# Patient Record
Sex: Male | Born: 1985 | Race: White | Hispanic: No | Marital: Single | Smoking: Never smoker
Health system: Southern US, Community
[De-identification: ages and names within clinical notes are randomized; demographics above are authoritative.]

## PROBLEM LIST (undated history)

## (undated) DIAGNOSIS — I10 Essential (primary) hypertension: Secondary | ICD-10-CM

## (undated) DIAGNOSIS — E785 Hyperlipidemia, unspecified: Secondary | ICD-10-CM

## (undated) DIAGNOSIS — G629 Polyneuropathy, unspecified: Secondary | ICD-10-CM

## (undated) DIAGNOSIS — E119 Type 2 diabetes mellitus without complications: Secondary | ICD-10-CM

## (undated) HISTORY — DX: Polyneuropathy, unspecified: G62.9

## (undated) HISTORY — PX: WISDOM TOOTH EXTRACTION: SHX21

## (undated) HISTORY — PX: MOUTH SURGERY: SHX715

## (undated) HISTORY — DX: Hyperlipidemia, unspecified: E78.5

---

## 2013-02-03 ENCOUNTER — Emergency Department: Payer: Self-pay | Admitting: Emergency Medicine

## 2013-02-03 LAB — CBC
HCT: 45.4 % (ref 40.0–52.0)
MCH: 29.2 pg (ref 26.0–34.0)
MCV: 82 fL (ref 80–100)
Platelet: 244 10*3/uL (ref 150–440)
RBC: 5.55 10*6/uL (ref 4.40–5.90)
RDW: 12.4 % (ref 11.5–14.5)
WBC: 8.5 10*3/uL (ref 3.8–10.6)

## 2013-02-03 LAB — COMPREHENSIVE METABOLIC PANEL
Albumin: 3.7 g/dL (ref 3.4–5.0)
Anion Gap: 9 (ref 7–16)
Bilirubin,Total: 0.6 mg/dL (ref 0.2–1.0)
Chloride: 101 mmol/L (ref 98–107)
Co2: 25 mmol/L (ref 21–32)
EGFR (Non-African Amer.): >60
Glucose: 351 mg/dL — ABNORMAL HIGH (ref 65–99)
SGOT(AST): 36 U/L (ref 15–37)
Sodium: 135 mmol/L — ABNORMAL LOW (ref 136–145)

## 2013-02-03 LAB — LIPASE, BLOOD: Lipase: 113 U/L (ref 73–393)

## 2016-05-18 DIAGNOSIS — Z7984 Long term (current) use of oral hypoglycemic drugs: Secondary | ICD-10-CM | POA: Insufficient documentation

## 2016-05-18 DIAGNOSIS — R55 Syncope and collapse: Secondary | ICD-10-CM | POA: Insufficient documentation

## 2016-05-18 DIAGNOSIS — E860 Dehydration: Secondary | ICD-10-CM | POA: Insufficient documentation

## 2016-05-18 DIAGNOSIS — E1165 Type 2 diabetes mellitus with hyperglycemia: Secondary | ICD-10-CM | POA: Diagnosis not present

## 2016-05-18 DIAGNOSIS — I10 Essential (primary) hypertension: Secondary | ICD-10-CM | POA: Insufficient documentation

## 2016-05-18 NOTE — ED Triage Notes (Signed)
Pt complaining of migraine x this am. Pt states hx of same, migraine feels same, just lasting longer. Pt also complaining of syncopal episode, unsure if struck head. Pt tachy at triage, 120's. Pt denies any chest pain or SHOB. Pt denies any lightheadedness or dizziness at this time. Pt is complaining of some nausea.

## 2016-05-19 ENCOUNTER — Encounter (HOSPITAL_COMMUNITY): Payer: Self-pay

## 2016-05-19 ENCOUNTER — Emergency Department (HOSPITAL_COMMUNITY)
Admission: EM | Admit: 2016-05-19 | Discharge: 2016-05-19 | Disposition: A | Discharge status: Home / Self Care | Payer: 59 | Attending: Emergency Medicine | Admitting: Emergency Medicine

## 2016-05-19 ENCOUNTER — Other Ambulatory Visit: Payer: Self-pay

## 2016-05-19 DIAGNOSIS — R739 Hyperglycemia, unspecified: Secondary | ICD-10-CM

## 2016-05-19 DIAGNOSIS — E860 Dehydration: Secondary | ICD-10-CM

## 2016-05-19 HISTORY — DX: Essential (primary) hypertension: I10

## 2016-05-19 HISTORY — DX: Type 2 diabetes mellitus without complications: E11.9

## 2016-05-19 LAB — BASIC METABOLIC PANEL
Anion gap: 10 (ref 5–15)
BUN: 8 mg/dL (ref 6–20)
CALCIUM: 9.3 mg/dL (ref 8.9–10.3)
CO2: 24 mmol/L (ref 22–32)
CREATININE: 0.76 mg/dL (ref 0.61–1.24)
Chloride: 99 mmol/L — ABNORMAL LOW (ref 101–111)
GFR calc non Af Amer: >60 mL/min (ref >60)
Glucose, Bld: 447 mg/dL — ABNORMAL HIGH (ref 65–99)
Potassium: 3.5 mmol/L (ref 3.5–5.1)
Sodium: 133 mmol/L — ABNORMAL LOW (ref 135–145)

## 2016-05-19 LAB — URINALYSIS, ROUTINE W REFLEX MICROSCOPIC
Bacteria, UA: NONE SEEN
Bilirubin Urine: NEGATIVE
HGB URINE DIPSTICK: NEGATIVE
Ketones, ur: 5 mg/dL — AB
Leukocytes, UA: NEGATIVE
Nitrite: NEGATIVE
PROTEIN: NEGATIVE mg/dL
Specific Gravity, Urine: 1.027 (ref 1.005–1.030)
Squamous Epithelial / LPF: NONE SEEN
pH: 5 (ref 5.0–8.0)

## 2016-05-19 LAB — CBC
HCT: 45.2 % (ref 39.0–52.0)
Hemoglobin: 16.6 g/dL (ref 13.0–17.0)
MCH: 29.5 pg (ref 26.0–34.0)
MCHC: 36.7 g/dL — ABNORMAL HIGH (ref 30.0–36.0)
MCV: 80.4 fL (ref 78.0–100.0)
PLATELETS: 306 10*3/uL (ref 150–400)
RBC: 5.62 MIL/uL (ref 4.22–5.81)
RDW: 12.6 % (ref 11.5–15.5)
WBC: 9.8 10*3/uL (ref 4.0–10.5)

## 2016-05-19 MED ORDER — CANAGLIFLOZIN 100 MG PO TABS: 100.0000 mg | ORAL_TABLET | Freq: Every day | ORAL | 0 refills | Status: DC

## 2016-05-19 MED ORDER — SODIUM CHLORIDE 0.9 % IV BOLUS (SEPSIS)
2000.0000 mL | Freq: Once | INTRAVENOUS | Status: AC
  Administered 2016-05-19: 2000 mL via INTRAVENOUS

## 2016-05-19 NOTE — ED Provider Notes (Signed)
[facility]-EMERGENCY DEPT Provider Note   CSN: [id number] Arrival date & time: 05/18/16  2355   By signing my name below, I, Clovis PuAvnee Patel, attest that this documentation has been prepared under the direction and in the presence of Azalia BilisKevin Ariaunna Longsworth, MD  Electronically Signed: Clovis PuAvnee Patel, ED Scribe. 05/19/16. 4:04 AM.   History   Chief Complaint Chief Complaint  Patient presents with  . Loss of Consciousness  . Migraine   The history is provided by the patient. No language interpreter was used.  HPI Comments:  Carl [name] is a 31 y.o. male who presents to the Emergency Department, with a hx of DM and HTN, who presents to the Emergency Department complaining of sudden onset, syncope related to a migraine which occurred in the AM yesterday. Pt also reports upper extremity weakness, neck stiffness, decrease in urinary output, nausea and vomiting. No alleviating factors noted. Pt denies any tongue biting, bladder/bowel incontinence, hx of seizure, palpitations and chest pain. Pt notes a hx of 1 similar episode which occurred a few years ago. Pt was on invokana for her DM but has run out of this medication.   Past Medical History:  Diagnosis Date  . Diabetes mellitus without complication (HCC)   . Hypertension     There are no active problems to display for this patient.   History reviewed. No pertinent surgical history.     Home Medications    Prior to Admission medications   Not on File    Family History History reviewed. No pertinent family history.  Social History Social History  Substance Use Topics  . Smoking status: Never Smoker  . Smokeless tobacco: Never Used  . Alcohol use Yes     Allergies   Patient has no allergy information on record.   Review of Systems Review of Systems 10 systems reviewed and all are negative for acute change except as noted in the HPI.  Physical Exam Updated Vital Signs BP 142/94   Pulse 103   Resp 13   SpO2 98%   Physical  Exam  Constitutional: He is oriented to person, place, and time. He appears well-developed and well-nourished.  HENT:  Head: Normocephalic and atraumatic.  Eyes: EOM are normal.  Neck: Normal range of motion.  Cardiovascular: Normal rate, regular rhythm, normal heart sounds and intact distal pulses.   Pulmonary/Chest: Effort normal and breath sounds normal. No respiratory distress.  Abdominal: Soft. He exhibits no distension. There is no tenderness.  Musculoskeletal: Normal range of motion.  Neurological: He is alert and oriented to person, place, and time.  Skin: Skin is warm and dry.  Psychiatric: He has a normal mood and affect. Judgment normal.  Nursing note and vitals reviewed.  ED Treatments / Results  DIAGNOSTIC STUDIES:  Oxygen Saturation is 98% on RA, normal by my interpretation.    COORDINATION OF CARE:  3:56 AM Discussed treatment plan with pt at bedside and pt agreed to plan.  Labs (all labs ordered are listed, but only abnormal results are displayed) Labs Reviewed  BASIC METABOLIC PANEL - Abnormal; Notable for the following:       Result Value   Sodium 133 (*)    Chloride 99 (*)    Glucose, Bld 447 (*)    All other components within normal limits  CBC - Abnormal; Notable for the following:    MCHC 36.7 (*)    All other components within normal limits  URINALYSIS, ROUTINE W REFLEX MICROSCOPIC - Abnormal; Notable for the following:  Glucose, UA >=500 (*)    Ketones, ur 5 (*)    All other components within normal limits  CBG MONITORING, ED    EKG  EKG Interpretation  Date/Time:  Friday May 19 2016 00:02:47 EST Ventricular Rate:  115 PR Interval:  164 QRS Duration: 80 QT Interval:  322 QTC Calculation: 445 R Axis:   20 Text Interpretation:  Sinus tachycardia Otherwise normal ECG No old tracing to compare Confirmed by Abrish Erny  MD, Caryn Bee ([id number]) on 05/19/2016 3:56:22 AM       Radiology No results found.  Procedures Procedures (including  critical care time)  Medications Ordered in ED Medications  sodium chloride 0.9 % bolus 2,000 mL (not administered)     Initial Impression / Assessment and Plan / ED Course  I have reviewed the triage vital signs and the nursing notes.  Pertinent labs & imaging results that were available during my care of the patient were reviewed by me and considered in my medical decision making (see chart for details).  Clinical Course     5:24 AM Pt feels better at this time. Dc home in good condition. Refill of his diabetes meds. Pt will need pcp follow up  Final Clinical Impressions(s) / ED Diagnoses   Final diagnoses:  None    New Prescriptions New Prescriptions   No medications on file   I personally performed the services described in this documentation, which was scribed in my presence. The recorded information has been reviewed and is accurate.        Azalia Bilis, MD 05/19/16 [phone]

## 2018-01-14 ENCOUNTER — Encounter: Payer: Self-pay | Admitting: Family Medicine

## 2018-01-14 ENCOUNTER — Ambulatory Visit (INDEPENDENT_AMBULATORY_CARE_PROVIDER_SITE_OTHER): Payer: Managed Care, Other (non HMO) | Admitting: Family Medicine

## 2018-01-14 ENCOUNTER — Other Ambulatory Visit: Payer: Self-pay

## 2018-01-14 VITALS — BP 160/92 | HR 98 | Temp 98.4°F | Ht 69.0 in | Wt 249.4 lb

## 2018-01-14 DIAGNOSIS — Z23 Encounter for immunization: Secondary | ICD-10-CM

## 2018-01-14 DIAGNOSIS — Z114 Encounter for screening for human immunodeficiency virus [HIV]: Secondary | ICD-10-CM

## 2018-01-14 DIAGNOSIS — E11319 Type 2 diabetes mellitus with unspecified diabetic retinopathy without macular edema: Secondary | ICD-10-CM

## 2018-01-14 DIAGNOSIS — E113593 Type 2 diabetes mellitus with proliferative diabetic retinopathy without macular edema, bilateral: Secondary | ICD-10-CM

## 2018-01-14 DIAGNOSIS — Z1159 Encounter for screening for other viral diseases: Secondary | ICD-10-CM | POA: Diagnosis not present

## 2018-01-14 DIAGNOSIS — F640 Transsexualism: Secondary | ICD-10-CM | POA: Insufficient documentation

## 2018-01-14 DIAGNOSIS — Z789 Other specified health status: Secondary | ICD-10-CM | POA: Insufficient documentation

## 2018-01-14 DIAGNOSIS — E119 Type 2 diabetes mellitus without complications: Secondary | ICD-10-CM | POA: Insufficient documentation

## 2018-01-14 DIAGNOSIS — I10 Essential (primary) hypertension: Secondary | ICD-10-CM | POA: Insufficient documentation

## 2018-01-14 MED ORDER — EMPAGLIFLOZIN 25 MG PO TABS
25.0000 mg | ORAL_TABLET | Freq: Every day | ORAL | 0 refills | Status: DC
Start: 2018-01-14 — End: 2018-02-22

## 2018-01-14 MED ORDER — SITAGLIPTIN PHOSPHATE 50 MG PO TABS: 50.0000 mg | ORAL_TABLET | Freq: Every day | ORAL | 3 refills | Status: DC

## 2018-01-14 NOTE — Patient Instructions (Addendum)
It was wonderful to see you today.  Thank you for choosing [facility].   Please call [phone] with any questions about today's appointment.  Please be sure to schedule follow up at the front  desk before you leave today.   Terisa Starr, MD  Family Medicine    Your medications are at the pharmacy   Your labs will be drawn today--- I will call you with results    I will see you in a month

## 2018-01-14 NOTE — Progress Notes (Signed)
Patient Name: Carl [name]  Date of Birth: 28-Mar-1986 Date of Visit: 01/14/18 PCP: Westley Chandler, MD  Chief Complaint: diabetes, discuss medications, PREP therapy   Subjective: Carl [name] is a 32 year old woman presenting today to establish care.  Carl [name] is alone today.  Overall she is doing well.  She reports she recently got a new job at [employer].  This allowed her to have health insurance which she is very pleased about.  She is enjoying her job so far.  Carl [name] has a long-standing history of type 2 diabetes.  She was actually diagnosed as a teen.  Her family history is significant for diabetes in both of her parents as well as grandparents.  She reports she is intolerant of metformin.  She has tried multiple versions and cannot tolerate the medication.  Previously she was very well controlled on an SGLT2 inhibitor DPP 4 inhibitor and small doses of Lantus.  She reports her complication of her diabetes so far has been retinopathy which very much worries her.  She reports her complication from diabetes has been her retinopathy which very much worries her.  She denies polyuria or polydipsia today she has not taken any medications recently she has been without health insurance.  [name] reports her blood pressures frequently elevated when she first presents the office.  Denies chest pain, shortness of breath, vision changes, lower extremity edema.  She does report intermittent right foot pain at times.  She reports her right foot and left ankles have been swelling intermittently at work she is on her feet quite a bit at work.  Denies redness, fevers, recent injury to the area.  She reports a history of migraine headaches.  These frequently occurred in her teen years.  Over the last few years she believes she has developed more of cluster type headaches with which worries her considerably she sometimes takes Excedrin for this.  She denies a history of migraines with aura.  Past medical  history: Diabetes, hyperlipidemia, transgender male  Past surgical history: Oral surgery  Family history significant for diabetes in her mother and father.  Social history: She attended undergraduate school where she received a degree in music.  She is a Museum/gallery curator.  She now works at [employer].  She is enjoying the job.  She finds she is safe in her living situation.  She does not smoke or regularly use alcohol.  She does not use other illicit substances.  In terms of her transgender status she reports that she first began to identify as a male in college.  She became [name] in 2016.  She feels much more comfortable in this body.  She has never had any gender affirming surgeries.  She does desire top surgery.  Her mom supports her this transition.  Her grandmother is somewhat ignores this change.  She is distant from her sister.  She is sexually active with men.  She does not regularly use condoms she has had more than 3 partners in the past year.  She does usually ask them their HIV status.  She is uncertain if she has had any HIV positive partners.  She does engage in anal sex without a condom.    ROS:  ROS  I have reviewed the patient's medical, surgical, family, and social history as appropriate.   Vitals:   01/14/18 0944  BP: (!) 160/92  Pulse: 98  Temp: 98.4 F (36.9 C)  SpO2: 99%   Filed Weights   01/14/18 0944  Weight: 249  lb 6.4 oz (113.1 kg)  BP repeat 138/80  HEENT: Sclera anicteric. Dentition is fair, missing several incisors. Appears well hydrated Neck: Supple, thick neck, no prominent thyroid cartilage Chest: moderate gynecomastia, presence of chest hair (shaved)  Cardiac: Regular rate and rhythm. Normal S1/S2. No murmurs, rubs, or gallops appreciated. Lungs: Clear bilaterally to ascultation.  Abdomen: Normoactive bowel sounds. No tenderness to deep or light palpation. No rebound or guarding.  Extremities: Warm, well perfused without edema. Right ankle with minimal  effusion   Skin: Warm, dry, callus on left dorsum of foot over MTP  Psych: Pleasant and appropriate, kind  Monofilament: reduced sensation on plantar surface of feet  [name] was seen today.   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  This is a chronic condition.  She may have a latency onset diabetes type that could require insulin. -     CBC -     Lipid Panel -     Comprehensive metabolic panel -     empagliflozin (JARDIANCE) 25 MG TABS tablet; Take 25 mg by mouth daily. -     sitaGLIPtin (JANUVIA) 50 MG tablet; Take 1 tablet (50 mg total) by mouth daily. -     Pneumococcal polysaccharide vaccine 23-valent greater than or equal to 2yo subcutaneous/IM  Screening for HIV (human immunodeficiency virus,  Screening for Hepatitis B  Candidate for PREP therapy reviewed most recent CDC guidelines with patient and discussed benefits of daily prep therapy at length. We discussed the benefits of PREP therapy at length recommended hepatitis B testing before this we discussed the interval testing including every throughout the requirement for HIV status testing as well as creatinine testing.  -     HIV antibody (with reflex) -  Offered other STI testing and she declined  - Hep B testing   Need for immunization against influenza -     Flu Vaccine QUAD 36+ mos IM  Transgender Care the patient prefers male pronouns.  We discussed the role of hormone therapy in her treatment.  Could certainly consider the addition of Aldactone as her blood pressure was slightly elevated today and this would have the added side effect of possible gynecomastia.  She would like to first get her diabetes under control before considering estrogen therapy. She has no contraindications to estrogen  therapy at this time.  She does not smoke.  She does not have a history of migraine with aura or history of venous thrombus embolism. Will give name for counselors providing letter of support for gender affirming therapy.    Terisa Starr, MD  Family Medicine Teaching Service

## 2018-01-15 ENCOUNTER — Other Ambulatory Visit (INDEPENDENT_AMBULATORY_CARE_PROVIDER_SITE_OTHER): Payer: Managed Care, Other (non HMO) | Admitting: Family Medicine

## 2018-01-15 ENCOUNTER — Telehealth: Payer: Self-pay

## 2018-01-15 DIAGNOSIS — E785 Hyperlipidemia, unspecified: Secondary | ICD-10-CM

## 2018-01-15 DIAGNOSIS — R739 Hyperglycemia, unspecified: Secondary | ICD-10-CM | POA: Diagnosis not present

## 2018-01-15 DIAGNOSIS — E11319 Type 2 diabetes mellitus with unspecified diabetic retinopathy without macular edema: Secondary | ICD-10-CM

## 2018-01-15 DIAGNOSIS — Z202 Contact with and (suspected) exposure to infections with a predominantly sexual mode of transmission: Secondary | ICD-10-CM

## 2018-01-15 LAB — POCT GLYCOSYLATED HEMOGLOBIN (HGB A1C): HbA1c, POC (controlled diabetic range): 13.3 % — AB (ref 0.0–7.0)

## 2018-01-15 NOTE — Telephone Encounter (Signed)
Pt called nurse line, states that even with the prior Gerre Couch is too expensive. Would like to know if something else could be called in. Pt call back [phone] Shawna Orleans, RN

## 2018-01-15 NOTE — Telephone Encounter (Signed)
Pt called nurse line, states insurance not covering his meds. Submitted PA via Covermymeds for [place]. Status pending. Will recheck in 24 hours. Shawna Orleans, RN

## 2018-01-15 NOTE — Telephone Encounter (Signed)
PA for both [place] approved. [facility] pharmacy notified, patient notified. Shawna Orleans, RN

## 2018-01-15 NOTE — Telephone Encounter (Signed)
Attempted to call patient.   Glipizide and metformin are both covered by insurance under Tier 1, which would be less expensive. She has tried metformin before (I would like to try again). Let me know which she prefers. Will try to call again tomorrow.

## 2018-01-15 NOTE — Telephone Encounter (Signed)
Pt requests a call back when we have answer regarding PA. Call back [phone]

## 2018-01-15 NOTE — Telephone Encounter (Signed)
LMOVM for pt to call us back and let us know if she wants to try metformin again. Damareon Lanni Bruna Potter, CMA

## 2018-01-16 LAB — COMPREHENSIVE METABOLIC PANEL
A/G RATIO: 1.7 (ref 1.2–2.2)
ALBUMIN: 4.3 g/dL (ref 3.5–5.5)
ALT: 56 IU/L — AB (ref 0–44)
AST: 38 IU/L (ref 0–40)
Alkaline Phosphatase: 112 IU/L (ref 39–117)
BILIRUBIN TOTAL: 0.2 mg/dL (ref 0.0–1.2)
BUN / CREAT RATIO: 16 (ref 9–20)
BUN: 10 mg/dL (ref 6–20)
CHLORIDE: 98 mmol/L (ref 96–106)
CO2: 18 mmol/L — ABNORMAL LOW (ref 20–29)
Calcium: 9.6 mg/dL (ref 8.7–10.2)
Creatinine, Ser: 0.64 mg/dL — ABNORMAL LOW (ref 0.76–1.27)
GFR calc non Af Amer: 131 mL/min/{1.73_m2} (ref >59)
GFR, EST AFRICAN AMERICAN: 152 mL/min/{1.73_m2} (ref >59)
GLUCOSE: 328 mg/dL — AB (ref 65–99)
Globulin, Total: 2.5 g/dL (ref 1.5–4.5)
POTASSIUM: 4.7 mmol/L (ref 3.5–5.2)
Sodium: 134 mmol/L (ref 134–144)
TOTAL PROTEIN: 6.8 g/dL (ref 6.0–8.5)

## 2018-01-16 LAB — CBC
HEMOGLOBIN: 15.4 g/dL (ref 13.0–17.7)
Hematocrit: 45.2 % (ref 37.5–51.0)
MCH: 29.2 pg (ref 26.6–33.0)
MCHC: 34.1 g/dL (ref 31.5–35.7)
MCV: 86 fL (ref 79–97)
PLATELETS: 359 10*3/uL (ref 150–450)
RBC: 5.27 x10E6/uL (ref 4.14–5.80)
RDW: 12.9 % (ref 12.3–15.4)
WBC: 6.9 10*3/uL (ref 3.4–10.8)

## 2018-01-16 LAB — LIPID PANEL
Chol/HDL Ratio: 23.3 ratio — ABNORMAL HIGH (ref 0.0–5.0)
Cholesterol, Total: 373 mg/dL — ABNORMAL HIGH (ref 100–199)
HDL: 16 mg/dL — ABNORMAL LOW (ref >39)
Triglycerides: 2273 mg/dL (ref 0–149)

## 2018-01-16 LAB — HEPATITIS B SURFACE ANTIGEN: HEP B S AG: NEGATIVE

## 2018-01-16 LAB — HIV ANTIBODY (ROUTINE TESTING W REFLEX): HIV Screen 4th Generation wRfx: NONREACTIVE

## 2018-01-16 LAB — HEPATITIS B CORE ANTIBODY, TOTAL: Hep B Core Total Ab: NEGATIVE

## 2018-01-16 LAB — HEPATITIS B SURFACE ANTIBODY,QUALITATIVE: Hep B Surface Ab, Qual: REACTIVE

## 2018-01-16 MED ORDER — ATORVASTATIN CALCIUM 10 MG PO TABS: 10.0000 mg | ORAL_TABLET | Freq: Every day | ORAL | 3 refills | Status: DC

## 2018-01-16 MED ORDER — METFORMIN HCL 500 MG PO TABS: 500.0000 mg | ORAL_TABLET | Freq: Every day | ORAL | 3 refills | Status: DC

## 2018-01-16 MED ORDER — EMTRICITABINE-TENOFOVIR DF 200-300 MG PO TABS: 1.0000 | ORAL_TABLET | Freq: Every day | ORAL | 0 refills | Status: DC

## 2018-01-16 NOTE — Telephone Encounter (Signed)
Pt called nurse line returning call. Pt stated would be fine restarting Metformin, however, does better on extended release. Pt is also concerned that Jardiance will no longer being covered if metformin is called in.  Please advise.

## 2018-01-16 NOTE — Telephone Encounter (Signed)
Called patient with lab results.  Confirmed Carl [name] was on the phone.   Diabetes reviewed A1c of 13.3.  She is amenable to trial of oral therapy at this time.  She reports previously her A1c was greater than 13.  She did require insulin for period of time.  We discussed starting insulin.  She is amenable to speaking to Dr. Raymondo Band or 1 of his team members.  I discussed the role of pharmacist in her care.  Will start with a low-dose of metformin as she is been intolerant of this in the past.  She is going to pick up Jardiance later today.  We reviewed the side effect profile and how to take the medication.  I will call her in 2 weeks to ensure that she is tolerating the metformin at that time I will plan to increase the metformin.  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  She is immune to hepatitis B.  HIV test negative.  She confirms that she had a previous negative HIV test more than a month ago.  She has not had intercourse since this time.  We discussed the role of prep therapy.  We reviewed that she is an appropriate candidate.  She has unprotected anal intercourse with multiple partners of whom her their HIV status she does not know.  We discussed that starting the therapy can result in some nausea and discomfort in her abdomen.  We discussed this should be taken and started separately from her metformin.  I would actually recommend starting this prior to her metformin therapy.  We discussed the role of testing for HIV with 3 months.  We discussed the importance of compliance.  We discussed the role of risk reduction strategies including use of condoms regularly.   Hypertriglyceridemia the patient has marked hypertriglyceridemia. Reviewed her family history carefully.  I suspect this is related to her uncontrolled hyperglycemia.  Given the  mortality benefit of a statin will start this at this time.  We will work closely on reducing her blood glucose.  Consideration of a fibrate in the future.  Repeat fasting at follow-up Will discuss further with Dr. Raymondo Band regarding her starting insulin therapy

## 2018-01-16 NOTE — Telephone Encounter (Signed)
Attempted to call  Left message

## 2018-01-24 ENCOUNTER — Telehealth: Payer: Self-pay | Admitting: Family Medicine

## 2018-01-24 DIAGNOSIS — E11319 Type 2 diabetes mellitus with unspecified diabetic retinopathy without macular edema: Secondary | ICD-10-CM

## 2018-01-24 MED ORDER — ACCU-CHEK SOFT TOUCH LANCETS MISC: 12 refills | Status: DC

## 2018-01-24 MED ORDER — ACCU-CHEK AVIVA DEVI: 0 refills | Status: AC

## 2018-01-24 MED ORDER — GLUCOSE BLOOD VI STRP
ORAL_STRIP | 12 refills | Status: DC
Start: 2018-01-24 — End: 2018-11-07

## 2018-01-24 NOTE — Telephone Encounter (Signed)
Called patient to follow-up regarding tolerance of medications.  She reports she is picked up all of her medications.  She has had worsening constipation.  She thinks this is due to metformin.  Recommend she stop for 2 to 3 days to see if this resolved.  She reports she is always had problems with tolerating metformin and believes this is the cause.  She is taking Truvada as prescribed.  She has not missed any days.  She denies side effects of this medications.  Discussed recommendation to check blood glucose each morning for next week.  Will call next week to follow-up blood glucose results.  If persistently elevated will start Lantus with Dr. Macky LowerKoval's assistance.   Carl Starrarina Brown, MD  Family Medicine Teaching Service

## 2018-02-01 ENCOUNTER — Telehealth: Payer: Self-pay | Admitting: Family Medicine

## 2018-02-01 NOTE — Telephone Encounter (Signed)
Left voicemail for Carl [name] to call back regarding blood glucoses in AM over last week.

## 2018-02-15 ENCOUNTER — Encounter: Payer: Self-pay | Admitting: Family Medicine

## 2018-02-15 ENCOUNTER — Other Ambulatory Visit: Payer: Self-pay

## 2018-02-15 ENCOUNTER — Ambulatory Visit (INDEPENDENT_AMBULATORY_CARE_PROVIDER_SITE_OTHER): Payer: Managed Care, Other (non HMO) | Admitting: Family Medicine

## 2018-02-15 VITALS — BP 148/84 | HR 111 | Temp 98.8°F | Ht 69.0 in | Wt 245.0 lb

## 2018-02-15 DIAGNOSIS — M26629 Arthralgia of temporomandibular joint, unspecified side: Secondary | ICD-10-CM | POA: Insufficient documentation

## 2018-02-15 DIAGNOSIS — L989 Disorder of the skin and subcutaneous tissue, unspecified: Secondary | ICD-10-CM

## 2018-02-15 DIAGNOSIS — E11319 Type 2 diabetes mellitus with unspecified diabetic retinopathy without macular edema: Secondary | ICD-10-CM | POA: Diagnosis not present

## 2018-02-15 DIAGNOSIS — E1165 Type 2 diabetes mellitus with hyperglycemia: Secondary | ICD-10-CM | POA: Diagnosis not present

## 2018-02-15 DIAGNOSIS — I10 Essential (primary) hypertension: Secondary | ICD-10-CM | POA: Diagnosis not present

## 2018-02-15 DIAGNOSIS — E785 Hyperlipidemia, unspecified: Secondary | ICD-10-CM

## 2018-02-15 DIAGNOSIS — M26623 Arthralgia of bilateral temporomandibular joint: Secondary | ICD-10-CM | POA: Diagnosis not present

## 2018-02-15 DIAGNOSIS — Z79899 Other long term (current) drug therapy: Secondary | ICD-10-CM

## 2018-02-15 MED ORDER — NAPROXEN 500 MG PO TABS: 500.0000 mg | ORAL_TABLET | Freq: Two times a day (BID) | ORAL | 0 refills | Status: DC

## 2018-02-15 MED ORDER — INSULIN PEN NEEDLE 30G X 8 MM MISC: 11 refills | Status: DC

## 2018-02-15 MED ORDER — LISINOPRIL 5 MG PO TABS: 5.0000 mg | ORAL_TABLET | Freq: Every day | ORAL | 3 refills | Status: DC

## 2018-02-15 MED ORDER — METFORMIN HCL 500 MG PO TABS: 500.0000 mg | ORAL_TABLET | Freq: Two times a day (BID) | ORAL | 3 refills | Status: DC

## 2018-02-15 MED ORDER — INSULIN GLARGINE 100 UNIT/ML SOLOSTAR PEN: 10.0000 [IU] | PEN_INJECTOR | Freq: Every day | SUBCUTANEOUS | 11 refills | Status: DC

## 2018-02-15 NOTE — Progress Notes (Signed)
Patient Name: Carl [name] Date of Birth: 1985/11/19 Date of Visit: 02/15/18 PCP: Westley Chandler, MD  Chief Complaint: diabetes check   Subjective:( Carl [name]) Carl [name] is a pleasant 32 y.o. year old woman with a history type 2 diabetes, elevated blood pressure readings and dyslipidemia presenting today for follow-up she has several concerns today  Carl [name]  very active in her choir.  She reports a several week history of intermittent jaw pain.  She has had this multiple times before she reports bilateral jaw pain.  This is worse at the end of the day.  She has associated mild ear pain.  She denies fevers, parotid gland swelling, neck pain, chest pain, difficulty breathing or swallowing.  She is uncertain if she cracked her teeth at night.  She has not recently seen a dentist  Carl [name] reports her morning blood glucoses range from 1 80-200.  She is taking metformin once daily and Jardiance once daily.  She is unable to afford Januvia.  She continues to take a statin.  She denies signs or symptoms of hypoglycemia.  She denies hypoglycemia.  She is interested in starting Lantus.  Carl [name] has a history of dyslipidemia.  We reviewed her most recent triglyceride level.  She is conduct a plant-based diet.  She has lost 5 pounds. We celebrated the success today.  Carl [name] reports a hyperpigmented skin lesion on her right medial lower extremity.  This occurs every few months.  The lesion comes on is mildly pruritic and it was away after several weeks.  No drainage bleeding or weeping.  She does not think this is related to insect bites   ROS:  ROS Negative for chest pain, worsening headaches, dyspnea.   I have reviewed the patient's medical, surgical, family, and social history as appropriate.   Vitals:   02/15/18 0918  BP: (!) 148/84  Pulse: (!) 111  Temp: 98.8 F (37.1 C)  SpO2: 97%   Filed Weights   02/15/18 0918  Weight: 245 lb (111.1 kg)   HEENT: Sclera anicteric. Dentition is  poor, no abscesses noted. Bilateral tenderness over TMJ. Appears well hydrated. Neck: Supple Cardiac: Regular rate and rhythm. Normal S1/S2. No murmurs, rubs, or gallops appreciated. Lungs: Clear bilaterally to ascultation.  Abdomen: Normoactive bowel sounds. No tenderness to deep or light palpation. No rebound or guarding.  Extremities: Warm, well perfused without edema.  Skin: Warm, dry, several hyperpigmented macules along medial right leg. Psych: Pleasant and appropriate     Carl [name] was seen today. She appears to be doing well.   Diagnoses and all orders for this visit:  Essential hypertension, goal <130/80, would encourage more aggressive goal given family history and age of patient. -     lisinopril (PRINIVIL,ZESTRIL) 5 MG tablet; Take 1 tablet (5 mg total) by mouth daily.  Type 2 diabetes mellitus with hyperglycemia, without long-term current use of insulin (HCC) -     Insulin Glargine (LANTUS) 100 UNIT/ML Solostar Pen; Inject 10 Units into the skin daily. Increase by 2 units for BG >150 -     Insulin Pen Needle (NOVOFINE) 30G X 8 MM MISC; Use to inject insulin daily -     Basic Metabolic Panel; Future -     Lipid Panel; Future - Increase metformin to twice a day - Repeat BMET at follow up -  Needs eye exam   Bilateral temporomandibular joint pain less likely other etiologies.  No signs of trigeminal neuralgia or temporal arteritis.  Could consider use of  tricyclic antidepressant in the future.  Recommended mouthguard use at night. -     naproxen (NAPROSYN) 500 MG tablet; Take 1 tablet (500 mg total) by mouth 2 (two) times daily with a meal.  Skin lesion, possibly insect bite vs. Contact dermatitis, less likely keratoacanthoma.   Dyslipidemia, repeat fasting lipid profile.   PREP.  HIV follow up.  Discussed obtaining other STI testing as well.  Terisa Starr, MD  Family Medicine Teaching Service

## 2018-02-15 NOTE — Assessment & Plan Note (Signed)
Denies signs or symptoms of elevated blood pressure.  Blood pressure has been repeatedly elevated.  Will start with ACE inhibitor at 5 mg.  May need to increase his dose.  Repeat basic metabolic panel in 2 weeks which has been ordered.  We reviewed the side effects including cough and very rare side effect of angioedema

## 2018-02-15 NOTE — Assessment & Plan Note (Addendum)
The patient has had fasting morning blood glucoses of 180 or higher.  She is compliant with Jardiance and metformin once daily at this time.  Given persistent hyperglycemia will start Lantus 10 units nightly.  Instructed to titrate up by 2 units each day for blood glucose is greater than 150 in the morning. We will check in in 2 weeks regarding fasting morning blood sugars

## 2018-02-15 NOTE — Assessment & Plan Note (Addendum)
Reviewed most recent lipid panel.  The patient is taking atorvastatin 40 mg daily.  Will repeat with fasting.  May increase to 80 mg in the future.  We will also consider addition of Vascepa 2 g twice daily.

## 2018-02-15 NOTE — Patient Instructions (Signed)
  Jaw pain: Aleve- just for 7 days---watch NSAIDS with your blood pressure  Repeat Labs in 3 weeks  Make an appointment with me in 2 months  Let me know if there are issues with the insulin  It was wonderful to see you today.  Thank you for choosing [facility].   Please call [phone] with any questions about today's appointment.  Please be sure to schedule follow up at the front  desk before you leave today.   Terisa Starr, MD  Family Medicine

## 2018-02-22 ENCOUNTER — Other Ambulatory Visit: Payer: Self-pay

## 2018-02-22 DIAGNOSIS — E113593 Type 2 diabetes mellitus with proliferative diabetic retinopathy without macular edema, bilateral: Secondary | ICD-10-CM

## 2018-02-22 MED ORDER — EMPAGLIFLOZIN 25 MG PO TABS: 25.0000 mg | ORAL_TABLET | Freq: Every day | ORAL | 3 refills | Status: DC

## 2018-03-08 ENCOUNTER — Other Ambulatory Visit: Payer: Managed Care, Other (non HMO)

## 2018-03-11 ENCOUNTER — Telehealth: Payer: Self-pay

## 2018-03-11 ENCOUNTER — Other Ambulatory Visit: Payer: Self-pay | Admitting: Family Medicine

## 2018-03-11 NOTE — Progress Notes (Unsigned)
Please call patient and remind her (goes by [facility]) that she is due for a check of her kidney function. This has been ordered.

## 2018-03-11 NOTE — Telephone Encounter (Signed)
Called and LVM for patient to call clinic and make an appointment for kidney function.  Glennie Hawk, CMA

## 2018-05-28 ENCOUNTER — Telehealth: Payer: Self-pay | Admitting: Family Medicine

## 2018-05-28 ENCOUNTER — Encounter: Payer: Self-pay | Admitting: Family Medicine

## 2018-05-28 NOTE — Telephone Encounter (Signed)
Called patient X2 to schedule appointment- unable to reach via telephone. Will send letter.

## 2018-07-05 ENCOUNTER — Other Ambulatory Visit: Payer: Self-pay

## 2018-07-05 ENCOUNTER — Emergency Department: Payer: Self-pay

## 2018-07-05 ENCOUNTER — Emergency Department
Admission: EM | Admit: 2018-07-05 | Discharge: 2018-07-05 | Disposition: A | Discharge status: Home / Self Care | Payer: Self-pay | Attending: Student in an Organized Health Care Education/Training Program | Admitting: Student in an Organized Health Care Education/Training Program

## 2018-07-05 ENCOUNTER — Encounter: Payer: Self-pay | Admitting: Emergency Medicine

## 2018-07-05 DIAGNOSIS — Z79899 Other long term (current) drug therapy: Secondary | ICD-10-CM | POA: Insufficient documentation

## 2018-07-05 DIAGNOSIS — I10 Essential (primary) hypertension: Secondary | ICD-10-CM | POA: Insufficient documentation

## 2018-07-05 DIAGNOSIS — J40 Bronchitis, not specified as acute or chronic: Secondary | ICD-10-CM | POA: Insufficient documentation

## 2018-07-05 DIAGNOSIS — E119 Type 2 diabetes mellitus without complications: Secondary | ICD-10-CM | POA: Insufficient documentation

## 2018-07-05 DIAGNOSIS — Z7984 Long term (current) use of oral hypoglycemic drugs: Secondary | ICD-10-CM | POA: Insufficient documentation

## 2018-07-05 LAB — BASIC METABOLIC PANEL
Anion gap: 14 (ref 5–15)
BUN: 12 mg/dL (ref 6–20)
CO2: 22 mmol/L (ref 22–32)
Calcium: 9 mg/dL (ref 8.9–10.3)
Chloride: 100 mmol/L (ref 98–111)
Creatinine, Ser: 0.88 mg/dL (ref 0.61–1.24)
GFR calc Af Amer: >60 mL/min (ref >60)
GFR calc non Af Amer: >60 mL/min (ref >60)
Glucose, Bld: 400 mg/dL — ABNORMAL HIGH (ref 70–99)
Potassium: 3.8 mmol/L (ref 3.5–5.1)
Sodium: 136 mmol/L (ref 135–145)

## 2018-07-05 LAB — CBC WITH DIFFERENTIAL/PLATELET
Abs Immature Granulocytes: 0.03 10*3/uL (ref 0.00–0.07)
Basophils Absolute: 0 10*3/uL (ref 0.0–0.1)
Basophils Relative: 0 %
Eosinophils Absolute: 0 10*3/uL (ref 0.0–0.5)
Eosinophils Relative: 0 %
HCT: 42.3 % (ref 39.0–52.0)
Hemoglobin: 14.7 g/dL (ref 13.0–17.0)
Immature Granulocytes: 0 %
Lymphocytes Relative: 13 %
Lymphs Abs: 1.3 10*3/uL (ref 0.7–4.0)
MCH: 28.2 pg (ref 26.0–34.0)
MCHC: 34.8 g/dL (ref 30.0–36.0)
MCV: 81.2 fL (ref 80.0–100.0)
Monocytes Absolute: 0.8 10*3/uL (ref 0.1–1.0)
Monocytes Relative: 9 %
Neutro Abs: 7.3 10*3/uL (ref 1.7–7.7)
Neutrophils Relative %: 78 %
Platelets: 248 10*3/uL (ref 150–400)
RBC: 5.21 MIL/uL (ref 4.22–5.81)
RDW: 12.6 % (ref 11.5–15.5)
WBC: 9.5 10*3/uL (ref 4.0–10.5)
nRBC: 0 % (ref 0.0–0.2)

## 2018-07-05 LAB — GLUCOSE, CAPILLARY: Glucose-Capillary: 345 mg/dL — ABNORMAL HIGH (ref 70–99)

## 2018-07-05 MED ORDER — SODIUM CHLORIDE 0.9 % IV BOLUS
1000.0000 mL | Freq: Once | INTRAVENOUS | Status: AC
  Administered 2018-07-05: 1000 mL via INTRAVENOUS

## 2018-07-05 MED ORDER — PSEUDOEPH-BROMPHEN-DM 30-2-10 MG/5ML PO SYRP: 5.0000 mL | ORAL_SOLUTION | Freq: Four times a day (QID) | ORAL | 0 refills | Status: DC | PRN

## 2018-07-05 MED ORDER — DOXYCYCLINE MONOHYDRATE 100 MG PO CAPS: 100.0000 mg | ORAL_CAPSULE | Freq: Two times a day (BID) | ORAL | 0 refills | Status: DC

## 2018-07-05 MED ORDER — LIDOCAINE 5 % EX PTCH: 1.0000 | MEDICATED_PATCH | CUTANEOUS | Status: DC

## 2018-07-05 MED ORDER — ACETAMINOPHEN 325 MG PO TABS
650.0000 mg | ORAL_TABLET | Freq: Once | ORAL | Status: DC | PRN
  Filled 2018-07-05: qty 2

## 2018-07-05 MED ORDER — BENZONATATE 100 MG PO CAPS
200.0000 mg | ORAL_CAPSULE | Freq: Once | ORAL | Status: AC
  Administered 2018-07-05: 200 mg via ORAL
  Filled 2018-07-05: qty 2

## 2018-07-05 MED ORDER — DOXYCYCLINE HYCLATE 100 MG PO TABS
100.0000 mg | ORAL_TABLET | Freq: Once | ORAL | Status: AC
  Administered 2018-07-05: 100 mg via ORAL
  Filled 2018-07-05: qty 1

## 2018-07-05 MED ORDER — KETOROLAC TROMETHAMINE 30 MG/ML IJ SOLN
30.0000 mg | Freq: Once | INTRAMUSCULAR | Status: AC
  Administered 2018-07-05: 30 mg via INTRAVENOUS
  Filled 2018-07-05: qty 1

## 2018-07-05 MED ORDER — ONDANSETRON HCL 4 MG/2ML IJ SOLN
4.0000 mg | Freq: Once | INTRAMUSCULAR | Status: AC
  Administered 2018-07-05: 4 mg via INTRAVENOUS
  Filled 2018-07-05: qty 2

## 2018-07-05 NOTE — ED Triage Notes (Signed)
Pt arrives with complaints of a productive cough, generalized body body aches, and fever that started yesterday.

## 2018-07-05 NOTE — ED Notes (Signed)

## 2018-07-05 NOTE — ED Provider Notes (Signed)
[facility] Emergency Department Provider Note   ____________________________________________   First MD Initiated Contact with Patient 07/05/18 1629     (approximate)  I have reviewed the triage vital signs and the nursing notes.   HISTORY  Chief Complaint Cough  HPI Carl [name] is a 33 y.o. adult patient presents with productive cough, generalized body aches, and fever started yesterday.  Patient state has taken flu shot for this season.  Patient state nausea and vomiting earlier today.  Patient state has taken flu shot for this season.  Patient presents for temperature 100.5 but states he took Tylenol prior to arrival.   Past Medical History:  Diagnosis Date  . Diabetes mellitus without complication (HCC)   . Dyslipidemia   . Hypertension   . Hypertension     Patient Active Problem List   Diagnosis Date Noted  . Skin lesion 02/15/2018  . 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 Dyslipidemia 02/15/2018  . Diabetes (HCC) 01/14/2018  . Essential hypertension 01/14/2018  . Prefers Male Pronouns--- [name]  01/14/2018    Past Surgical History:  Procedure Laterality Date  . MOUTH SURGERY      Prior to Admission medications   Medication Sig Start Date End Date Taking? Authorizing Provider  atorvastatin (LIPITOR) 10 MG tablet Take 1 tablet (10 mg total) by mouth daily. 01/16/18   Westley Chandler, MD  Blood Glucose Monitoring Suppl (ACCU-CHEK AVIVA) device Check glucose once daily 01/24/18 01/24/19  Westley Chandler, MD  brompheniramine-pseudoephedrine-DM 30-2-10 MG/5ML syrup Take 5 mLs by mouth 4 (four) times daily as needed. 07/05/18   Joni Reining, PA-C  doxycycline (MONODOX) 100 MG capsule Take 1 capsule (100 mg total) by mouth 2 (two) times daily. 07/05/18   Joni Reining, PA-C  empagliflozin (JARDIANCE) 25 MG TABS tablet Take 25 mg by mouth daily. 02/22/18   Westley Chandler, MD  emtricitabine-tenofovir (TRUVADA) 200-300 MG tablet Take 1 tablet by  mouth daily. 01/16/18   Westley Chandler, MD  glucose blood [facility] ACTIVE STRIPS) test strip Use as instructed 01/24/18   Westley Chandler, MD  Insulin Glargine (LANTUS) 100 UNIT/ML Solostar Pen Inject 10 Units into the skin daily. Increase by 2 units for BG >150 02/15/18   Westley Chandler, MD  Insulin Pen Needle (NOVOFINE) 30G X 8 MM MISC Use to inject insulin daily 02/15/18   Westley Chandler, MD  Lancets (ACCU-CHEK SOFT [name]) lancets Check glucose once daily 01/24/18   Westley Chandler, MD  lisinopril (PRINIVIL,ZESTRIL) 5 MG tablet Take 1 tablet (5 mg total) by mouth daily. 02/15/18   Westley Chandler, MD  metFORMIN (GLUCOPHAGE) 500 MG tablet Take 1 tablet (500 mg total) by mouth 2 (two) times daily with a meal. 02/15/18   Westley Chandler, MD  naproxen (NAPROSYN) 500 MG tablet Take 1 tablet (500 mg total) by mouth 2 (two) times daily with a meal. 02/15/18   Westley Chandler, MD    Allergies Patient has no known allergies.  Family History  Problem Relation Age of Onset  . Diabetes Mother   . Diabetes Father   . Diabetes Paternal Grandmother   . Diabetes Paternal Grandfather     Social History Social History   Tobacco Use  . Smoking status: Never Smoker  . Smokeless tobacco: Never Used  Substance Use Topics  . Alcohol use: Yes  . Drug use: No    Review of Systems  Constitutional: No fever/chills Eyes: No visual changes.  ENT: No sore throat. Cardiovascular: Denies chest pain. Respiratory: Denies shortness of breath. Gastrointestinal: No abdominal pain.  No nausea, no vomiting.  No diarrhea.  No constipation. Genitourinary: Negative for dysuria. Musculoskeletal: Negative for back pain. Skin: Negative for rash. Neurological: Negative for headaches, focal weakness or numbness. Endocrine:  Diabetes, dyslipidemia, and hypertension.  Allergic/Immunilogical: HIV. ____________________________________________   PHYSICAL EXAM:  VITAL SIGNS: ED Triage Vitals  Enc Vitals Group      BP 07/05/18 1618 140/70     Pulse Rate 07/05/18 1618 (!) 129     Resp 07/05/18 1618 18     Temp 07/05/18 1618 (!) 100.5 F (38.1 C)     Temp src --      SpO2 07/05/18 1618 96 %     Weight 07/05/18 1619 242 lb (109.8 kg)     Height 07/05/18 1619  (1.778 m)     Head Circumference --      Peak Flow --      Pain Score --      Pain Loc --      Pain Edu? --      Excl. in GC? --     Constitutional: Alert and oriented. Well appearing and in no acute distress. Eyes: Conjunctivae are normal. PERRL. EOMI. Head: Atraumatic. Nose: No congestion/rhinnorhea. Mouth/Throat: Mucous membranes are moist.  Oropharynx non-erythematous. Neck: No stridor.  Hematological/Lymphatic/Immunilogical: No cervical lymphadenopathy. Cardiovascular: Normal rate, regular rhythm. Grossly normal heart sounds.  Good peripheral circulation.  Tachycardic. Respiratory: Normal respiratory effort.  No retractions. Lungs CTAB. Gastrointestinal: Soft and nontender. No distention. No abdominal bruits. No CVA tenderness. Musculoskeletal: No lower extremity tenderness nor edema.  No joint effusions. Neurologic:  Normal speech and language. No gross focal neurologic deficits are appreciated. No gait instability. Skin:  Skin is warm, dry and intact. No rash noted. Psychiatric: Mood and affect are normal. Speech and behavior are normal.  ____________________________________________   LABS (all labs ordered are listed, but only abnormal results are displayed)  Labs Reviewed  BASIC METABOLIC PANEL - Abnormal; Notable for the following components:      Result Value   Glucose, Bld 400 (*)    All other components within normal limits  GLUCOSE, CAPILLARY - Abnormal; Notable for the following components:   Glucose-Capillary 345 (*)    All other components within normal limits  CBC WITH DIFFERENTIAL/PLATELET  CBG MONITORING, ED    ____________________________________________  EKG   ____________________________________________  RADIOLOGY  ED MD interpretation:    Official radiology report(s): Dg Chest 2 View  Result Date: 07/05/2018 CLINICAL DATA:  Cough and fever EXAM: CHEST - 2 VIEW COMPARISON:  None. FINDINGS: Lungs are clear. Heart size and pulmonary vascularity are normal. No adenopathy. No bone lesions. IMPRESSION: No edema or consolidation. Electronically Signed   By: Bretta Bang III M.D.   On: 07/05/2018 16:58    ____________________________________________   PROCEDURES  Procedure(s) performed (including Critical Care):  Procedures   ____________________________________________   INITIAL IMPRESSION / ASSESSMENT AND PLAN / ED COURSE  As part of my medical decision making, I reviewed the following data within the electronic MEDICAL RECORD NUMBER     Patient presents onset of productive cough, generalized body aches, and fever which started yesterday.  Physical exam is consistent with bronchitis.  Patient given discharge care instructions.  Discussed negative x-ray findings.  Patient advised follow-up PCP to discuss better glucose control.  Take medications as directed.      ____________________________________________   FINAL CLINICAL IMPRESSION(S) /  ED DIAGNOSES  Final diagnoses:  None     ED Discharge Orders         Ordered    brompheniramine-pseudoephedrine-DM 30-2-10 MG/5ML syrup  4 times daily PRN     07/05/18 1806    doxycycline (MONODOX) 100 MG capsule  2 times daily     07/05/18 1806           Note:  This document was prepared using Dragon voice recognition software and may include unintentional dictation errors.    Joni Reining, PA-C 07/05/18 1810    Willy Eddy, MD 07/05/18 2025

## 2018-07-05 NOTE — Discharge Instructions (Addendum)
Follow discharge care instructions take medication as directed.  Advised to follow-up with PCP to discuss glucose control.

## 2018-11-07 ENCOUNTER — Ambulatory Visit (INDEPENDENT_AMBULATORY_CARE_PROVIDER_SITE_OTHER): Payer: Self-pay | Admitting: Family Medicine

## 2018-11-07 ENCOUNTER — Encounter: Payer: Self-pay | Admitting: Family Medicine

## 2018-11-07 ENCOUNTER — Other Ambulatory Visit (HOSPITAL_COMMUNITY)
Admission: RE | Admit: 2018-11-07 | Discharge: 2018-11-07 | Disposition: A | Discharge status: Home / Self Care | Payer: Self-pay | Source: Ambulatory Visit | Attending: Family Medicine | Admitting: Family Medicine

## 2018-11-07 ENCOUNTER — Other Ambulatory Visit: Payer: Self-pay

## 2018-11-07 ENCOUNTER — Telehealth: Payer: Self-pay | Admitting: *Deleted

## 2018-11-07 VITALS — BP 138/92 | HR 101

## 2018-11-07 DIAGNOSIS — Z113 Encounter for screening for infections with a predominantly sexual mode of transmission: Secondary | ICD-10-CM | POA: Insufficient documentation

## 2018-11-07 DIAGNOSIS — Z91148 Patient's other noncompliance with medication regimen for other reason: Secondary | ICD-10-CM | POA: Insufficient documentation

## 2018-11-07 DIAGNOSIS — E11319 Type 2 diabetes mellitus with unspecified diabetic retinopathy without macular edema: Secondary | ICD-10-CM

## 2018-11-07 DIAGNOSIS — Z9114 Patient's other noncompliance with medication regimen: Secondary | ICD-10-CM | POA: Insufficient documentation

## 2018-11-07 DIAGNOSIS — I10 Essential (primary) hypertension: Secondary | ICD-10-CM

## 2018-11-07 DIAGNOSIS — E1165 Type 2 diabetes mellitus with hyperglycemia: Secondary | ICD-10-CM

## 2018-11-07 DIAGNOSIS — Z79899 Other long term (current) drug therapy: Secondary | ICD-10-CM | POA: Insufficient documentation

## 2018-11-07 LAB — POCT GLYCOSYLATED HEMOGLOBIN (HGB A1C): HbA1c, POC (controlled diabetic range): 12.9 % — AB (ref 0.0–7.0)

## 2018-11-07 MED ORDER — ACCU-CHEK ACTIVE VI STRP: ORAL_STRIP | 12 refills | Status: DC

## 2018-11-07 MED ORDER — INSULIN GLARGINE 100 UNIT/ML SOLOSTAR PEN: 10.0000 [IU] | PEN_INJECTOR | Freq: Every day | SUBCUTANEOUS | 11 refills | Status: DC

## 2018-11-07 MED ORDER — INSULIN PEN NEEDLE 30G X 8 MM MISC: 11 refills | Status: DC

## 2018-11-07 NOTE — Assessment & Plan Note (Signed)
Will reach out to [facility]. Patient called during appointment. Baseline labs obtained again today. Will work to establish with [facility] vs. Obtain Truvada through another source in near future.

## 2018-11-07 NOTE — Progress Notes (Signed)
21 

## 2018-11-07 NOTE — Telephone Encounter (Signed)
Yes- once per day before insulin injection in AM. Please call [facility] and let them know. Thanks!

## 2018-11-07 NOTE — Progress Notes (Addendum)
  Patient Name: Carl [name] Date of Birth: 1985-11-15 Date of Visit: 11/07/18 PCP: Martyn Malay, MD  Chief Complaint: restart medications, new partner and questions   Subjective: Carl [name]  is a pleasant 33 y.o. with medical history significant for hypertension, type 2 diabetes, obesity, and hypertension  presenting today for check-in.  The patient has not been seen in about 9 months due to insurance.  She has lost her job and is on been unable to find new employment due to the current pandemic.  She finds considerable stress in this.  Gender affirming therapy Most interested in hair removal at this time and has been seeking treatment for this.   New partner The patient does have a new partner their name is Carl [name].  They have not yet started to become intimate but plan to do so in the coming weeks.  They are going to [place] next week.  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  Diabetes Patient's A1c today is 12.2.  She denies polyuria or polydipsia. Endorses blurry vision at times.   HTN Not taking any medications due to insurance status. Denies headaches or chest pain.    ROS: As above  ROS  I have reviewed the patient's medical, surgical, family, and social history as appropriate.   Vitals:   11/07/18 0957  BP: (!) 138/92  Pulse: (!) 101  SpO2: 99%   There were no vitals filed for this visit. HEENT: Sclera anicteric. Dentition is poor . Appears well hydrated. Neck: Supple Cardiac: Regular rate and rhythm. Normal S1/S2. No murmurs, rubs, or gallops appreciated. Lungs: Clear bilaterally to ascultation.  Skin: Warm, dry ecchymoses, healing, on bilateral lower extremities Psych: Pleasant and appropriate    Diabetes ([place]) Patient currently without insurance.  He has been off medications for many months.  He endorsed some blurry vision.  No polyuria polydipsia.  A sample of Lantus was given to him today also set him up with [employer] financial assistance.  He  lives in [place] which may make any medications difficult.  Will discuss again with financial coordinator. Started Lantus 10 U will titrate upwards, based on AM BG. Rx for test strips and pen needles, has other supplies.   Essential hypertension No longer on medications, BP still elevated. Has strong family history of CKD. Will restart as able with financial assistance (ACE first).   On pre-exposure prophylaxis for HIV Will reach out to [facility]. Patient called during appointment. Baseline labs obtained again today. Will work to establish with [facility] vs. Obtain Truvada through another source this week.   Insurance Concerns, paperwork for [employer] given to patient. Will work with Kennyth Lose re: resources in [place].   RTC in 1 month.  Will call with labs.   Dorris Singh, MD  Family Medicine Teaching Service

## 2018-11-07 NOTE — Assessment & Plan Note (Signed)
Patient currently without insurance.  He has been off medications for many months.  He endorsed some blurry vision.  No polyuria polydipsia.  A sample of Lantus was given to him today also set him up with [employer] [facility] financial assistance.  He lives in [place] which may make any medications difficult.  Will discuss again with financial coordinator.

## 2018-11-07 NOTE — Telephone Encounter (Signed)
Shane informed. Christen Bame, CMA

## 2018-11-07 NOTE — Telephone Encounter (Signed)
Shane from [employer] called. They need to know how often pt is checking sugars.  Looks like once a day but want to verify with MD.  Christen Bame, CMA

## 2018-11-07 NOTE — Patient Instructions (Addendum)
It was wonderful to see you today.  Thank you for choosing [facility].   Please call [phone] with any questions about today's appointment.  Please be sure to schedule follow up at the front  desk before you leave today.   Dorris Singh, MD  Family Medicine     We will call you with the results of your blood work.  For your diabetes your A1c is quite elevated.  It is important that we are able to get you restarted on your medications.    For your insulin please go obtain your test strips and pen needles today.  Tomorrow morning check your blood sugar.  If it is over 150 inject 10 units of insulin.  For every day your blood sugars were over 150 go by 2 units.  For example if your blood sugar in the morning is 176 increase your insulin 12 units.  Continue increasing by 2 units each day until your morning blood sugar is less than 150.  Once your blood sugar is 150 or less in the morning you can continue the same dose of insulin that you were on the day before.  If your blood sugar is less than 100 in the morning please call our clinic.  Do not inject your insulin before you call our clinic on that day.    Please fill out the financial aid packet for [facility]   Follow these steps to PrEP  1. Call [phone] and make an appointment   2. Bring proof of address, 2 pay stubs, or W-2 to your first appointment  3. Meet with clinical pharmacist & staff to discuss PrEP regimen & complete financial assistance application  4. Complete lab work  5. Receive PrEP medication the following week    [facility], [address] [place] [address] [phone]

## 2018-11-07 NOTE — Assessment & Plan Note (Signed)
No longer on medications, BP still elevated. Has strong family history of CKD. Will restart as able with financial assistance ([facility] first).

## 2018-11-09 LAB — BASIC METABOLIC PANEL
BUN/Creatinine Ratio: 11 (ref 9–20)
BUN: 9 mg/dL (ref 6–20)
CO2: 19 mmol/L — ABNORMAL LOW (ref 20–29)
Calcium: 9.7 mg/dL (ref 8.7–10.2)
Chloride: 98 mmol/L (ref 96–106)
Creatinine, Ser: 0.81 mg/dL (ref 0.76–1.27)
GFR calc Af Amer: 137 mL/min/{1.73_m2} (ref >59)
GFR calc non Af Amer: 118 mL/min/{1.73_m2} (ref >59)
Glucose: 414 mg/dL — ABNORMAL HIGH (ref 65–99)
Potassium: 4.4 mmol/L (ref 3.5–5.2)
Sodium: 136 mmol/L (ref 134–144)

## 2018-11-09 LAB — HIV ANTIBODY (ROUTINE TESTING W REFLEX): HIV Screen 4th Generation wRfx: NONREACTIVE

## 2018-11-09 LAB — HCV COMMENT:

## 2018-11-09 LAB — RPR: RPR Ser Ql: NONREACTIVE

## 2018-11-09 LAB — HEPATITIS C ANTIBODY (REFLEX): HCV Ab: <0.1 s/co ratio (ref 0.0–0.9)

## 2018-11-09 LAB — HEPATITIS B SURFACE ANTIGEN: Hepatitis B Surface Ag: NEGATIVE

## 2018-11-10 ENCOUNTER — Telehealth: Payer: Self-pay | Admitting: Family Medicine

## 2018-11-10 NOTE — Telephone Encounter (Signed)
Called patient to discuss results, all questions answered. She will call [facility] re: PREP tomorrow. Will call [facility] again tomorrow. Dorris Singh, MD  Family Medicine Teaching Service

## 2018-11-10 NOTE — Telephone Encounter (Signed)
Attempted to call patient with results.   Dorris Singh, MD  Family Medicine Teaching Service

## 2018-11-11 ENCOUNTER — Telehealth: Payer: Self-pay

## 2018-11-11 NOTE — Telephone Encounter (Signed)
Tomorrow at 230pm is the only time this week because I am on PAL starting on Wednesday. If not, then I've got availability all next week. Thank you!

## 2018-11-11 NOTE — Telephone Encounter (Signed)
PCP called to coordinate prep therapy for patient. Patient would like appointment this week. [name] "she" is going on vaction next week .  Routing to Cassie for advise to schedule patient this week

## 2018-11-11 NOTE — Telephone Encounter (Signed)
Left voicemail for [facility] to coordinate prep therapy. Dorris Singh, MD  Family Medicine Teaching Service

## 2018-11-11 NOTE — Telephone Encounter (Signed)
Patient called to offer appointment for 7/7. Patient declined. Scheduled Prep appointment for next week.  Eugenia Mcalpine, LPN

## 2018-11-11 NOTE — Telephone Encounter (Signed)
Thank you :)

## 2018-11-11 NOTE — Telephone Encounter (Signed)
Sorry just saw that she will be on vacation next week.  But, tomorrow at 230pm is the only time I can fit her in. Thanks!

## 2018-11-12 LAB — URINE CYTOLOGY ANCILLARY ONLY
Chlamydia: NEGATIVE
Neisseria Gonorrhea: NEGATIVE

## 2018-11-21 ENCOUNTER — Telehealth: Payer: Self-pay | Admitting: Pharmacy Technician

## 2018-11-21 ENCOUNTER — Encounter: Payer: Self-pay | Admitting: Pharmacist

## 2018-11-21 NOTE — Telephone Encounter (Signed)
[facility] Patient Advocate Encounter ° °Insurance verification completed.   ° °The patient is uninsured and will need patient assistance for medication. ° °We can complete the application and will need to meet with the patient for signatures and income documentation. ° °Rodrecus Belsky E. Artia Singley, CPhT °Specialty Pharmacy Patient Advocate °[facility] °Phone: [phone] °Fax:  [phone] ° ° °

## 2020-12-13 ENCOUNTER — Ambulatory Visit: Payer: BC Managed Care – PPO | Admitting: Family Medicine

## 2020-12-13 ENCOUNTER — Other Ambulatory Visit: Payer: Self-pay

## 2020-12-13 ENCOUNTER — Encounter: Payer: Self-pay | Admitting: Family Medicine

## 2020-12-13 VITALS — BP 180/90 | HR 104 | Ht 70.0 in | Wt 235.0 lb

## 2020-12-13 DIAGNOSIS — Z114 Encounter for screening for human immunodeficiency virus [HIV]: Secondary | ICD-10-CM

## 2020-12-13 DIAGNOSIS — Z1159 Encounter for screening for other viral diseases: Secondary | ICD-10-CM

## 2020-12-13 DIAGNOSIS — E11319 Type 2 diabetes mellitus with unspecified diabetic retinopathy without macular edema: Secondary | ICD-10-CM

## 2020-12-13 DIAGNOSIS — I10 Essential (primary) hypertension: Secondary | ICD-10-CM

## 2020-12-13 DIAGNOSIS — R200 Anesthesia of skin: Secondary | ICD-10-CM

## 2020-12-13 DIAGNOSIS — Z79899 Other long term (current) drug therapy: Secondary | ICD-10-CM

## 2020-12-13 DIAGNOSIS — E1165 Type 2 diabetes mellitus with hyperglycemia: Secondary | ICD-10-CM

## 2020-12-13 DIAGNOSIS — R202 Paresthesia of skin: Secondary | ICD-10-CM

## 2020-12-13 DIAGNOSIS — M25512 Pain in left shoulder: Secondary | ICD-10-CM

## 2020-12-13 DIAGNOSIS — G8929 Other chronic pain: Secondary | ICD-10-CM

## 2020-12-13 LAB — POCT GLYCOSYLATED HEMOGLOBIN (HGB A1C): HbA1c, POC (controlled diabetic range): 11.4 % — AB (ref 0.0–7.0)

## 2020-12-13 MED ORDER — INSULIN PEN NEEDLE 30G X 8 MM MISC: 11 refills | Status: DC

## 2020-12-13 MED ORDER — ACCU-CHEK AVIVA PLUS W/DEVICE KIT: PACK | 0 refills | Status: DC

## 2020-12-13 MED ORDER — GLUCOSE BLOOD VI STRP: ORAL_STRIP | 1 refills | Status: DC

## 2020-12-13 MED ORDER — ACCU-CHEK SOFTCLIX LANCETS MISC: 12 refills | Status: DC

## 2020-12-13 MED ORDER — LOSARTAN POTASSIUM 50 MG PO TABS: 50.0000 mg | ORAL_TABLET | Freq: Every day | ORAL | 3 refills | Status: DC

## 2020-12-13 MED ORDER — INSULIN GLARGINE 100 UNIT/ML SOLOSTAR PEN: 10.0000 [IU] | PEN_INJECTOR | Freq: Every day | SUBCUTANEOUS | 1 refills | Status: DC

## 2020-12-13 NOTE — Progress Notes (Signed)
SUBJECTIVE:   CHIEF COMPLAINT: check up, re-establish care HPI:   Carl [name] is a 35 y.o. yo with history notable for type 2 diabetes, hypertension, and obesity presenting for follow up .  The patient is taking Flonase, Zyrtec, and Nexium. No medications for DM or HTN due to lapse of insurance.  She is reestablishing care today as she has been out of insurance.  She is living with her grandparents.  She is not currently sexually active.  She is currently working at [employer].  Bilateral Leg Numbness  The patient has noticed quite sudden onset of bilateral numbness and tingling in her lower extremities.  She reports that this kind of started out of the blue.  She does report that is worse when her sugars are higher.  She denies urinary incontinence, back pain upper extremity numbness or tingling.  She is not very restrictive diet.  Hypertension The patient is taking no medications currently.  She reports persistent cough from lisinopril.  She does notice more frequent migraines the past several years but no headache today.  She denies difficulty seeing, chest pain, dyspnea on exertion or headache.   She has a family history of renal disease.  Type 2 Diabetes  The patient has an A1c of 11.4 today.  She is taking no diabetic medications currently.  She reports she has tried metformin now for times and has persistent stomach upset from this.  She is interested in a continuous glucose monitor.  She is used Lantus pens before and is interested in trying these again.  She would like to follow-up with pharmacy to discuss her medication options as well as consider a continuous glucose monitor if her insurance would pay for it.  Left Shoulder Pain  The patient is right-hand dominant.  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  She was seen in urgent care with this and diagnosed with it.  She has not had imaging.  She reports limited range of motion and some pain and  stiffness in her left shoulder.  She has intermittent neck pain.  She denies radiating pain, weakness in her hand or wrist.  Cough The patient reports several year history of cough.  She thinks this is been ongoing about a year.  She reports she uses Flonase daily and a PPI.  She knows when she eats spicy food she persistently coughs after this.  She also has an intermittent cough that is nonproductive.  She denies fevers, chills, unintentional weight loss.  She does report a remote history of asthma as a child and reports intermittent bronchospasm symptoms at times.  She does not smoke or use cannabis products.  There is no smoke in the household.  She is compliant with her PPI therapy and intranasal corticosteroid.  PERTINENT  PMH / PSH/Family/Social History : updated and reviewed   OBJECTIVE:   BP (!) 180/90   Pulse (!) 104   Ht 5\' 10"  (1.778 m)   Wt 235 lb (106.6 kg)   SpO2 97%   BMI 33.72 kg/m   Today's weight:  Last Weight  Most recent update: 12/13/2020  8:39 AM    Weight  106.6 kg (235 lb)            Review of prior weights: 02/12/2021   12/13/20 0838  Weight: 235 lb (106.6 kg)     Cardiac: Regular rate and rhythm. Normal S1/S2. No murmurs, rubs, or gallops appreciated. Lungs: Clear bilaterally  to ascultation.  Abdomen: Normoactive bowel sounds. No tenderness to deep or light palpation. No rebound or guarding.  Psych: Pleasant and appropriate   Insensate to monofilament bilaterally. She is insensate to monofilament to the knee on the right in the mid calf on the left.  She does have some atrophy of the left medial gastrocnemius.  Neck examined there is mild kyphosis.  Tenderness to palpation along left trapezius.  Also some tenderness to palpation along the left biceps.  Reduced forward flexion and AB duction with both active and passive range of motion on left as compared to right.  Positive empty can test on left. Patellar reflexes 1+  Normal gait  Negative  spurlings for pain or reproducibility  ASSESSMENT/PLAN:   Essential hypertension Had a cough with lisinopril.  Restarted losartan at 50 mg given elevation.  Suspect she will need a second agent.  Repeat BMP with Dr. Landry Dyke visit.  Diabetes (HCC) Discussed long-term complications.  See separate problem for neuropathy.  Denies nausea, vomiting, polyuria, polydipsia.  Restarted Lantus 10 units and expect will need to titrate this up.  Have also sent full supplies to her pharmacy.  We discussed reasons to call and return to care.  We discussed reasons not to take insulin therapy.  Consider SGLT2 inhibitor or GLP-1 follow-up.  Ultimately she may need both.    Left shoulder pain, possibly due to frozen shoulder although osteoarthritis remains on differential.  Less likely cervical disc disease given negative Spurling's test.  The symptoms are mainly loss of range of motion which makes the referred pain much less likely.  We will start with an x-ray.  Suspect we will need to pursue physical therapy and possible evaluation with sports medicine for injection if this truly is frozen shoulder.  Rotator cuff tear also on differential.  Screening STI we will obtain baseline labs in case she wants to restart PrEP.  Bilateral lower extremity numbness and tingling likely due to diabetic neuropathy given the atrophy of her medial gastroc also considered other less common neuropathic conditions.  If does not improve with improved glycemic control which I would expect that showed with a sudden onset will refer to neurology to confirm cause.  B12 today.  Consider EMG and nerve conduction studies at follow-up. Will also obtain CBC today to evaluate for anemia.   Persistent cough unclear if due to asthma or other etiology.  Will obtain pulmonary function test to further evaluate.  If persists consider chest x-ray.  Less likely but also considered is cardiac cause.  I think this is less likely as this does not come with  exertion, comes with food and is been ongoing for considerable amount of time.  HCM UTD on Covid vaccine Eye referral at follow up   Next Visit - Refer to SM pending Xray - Evaluate PFT-consider CXR and EKG      Terisa Starr, MD  Family Medicine Teaching Service  [facility]

## 2020-12-13 NOTE — Assessment & Plan Note (Signed)
Discussed long-term complications.  See separate problem for neuropathy.  Denies nausea, vomiting, polyuria, polydipsia.  Restarted Lantus 10 units and expect will need to titrate this up.  Have also sent full supplies to her pharmacy.  We discussed reasons to call and return to care.  We discussed reasons not to take insulin therapy.  Consider SGLT2 inhibitor or GLP-1 follow-up.  Ultimately she may need both.

## 2020-12-13 NOTE — Assessment & Plan Note (Signed)
Had a cough with lisinopril.  Restarted losartan at 50 mg given elevation.  Suspect she will need a second agent.  Repeat BMP with Dr. Landry Dyke visit.

## 2020-12-13 NOTE — Patient Instructions (Addendum)
It was wonderful to see you today.  Please bring ALL of your medications with you to every visit.   Today we talked about:  --Starting 10 units of insulin daily--this is lantus--inject once a day   --Follow up with Dr. Nicholaus Bloom next Tuesday at 830 AM  -Please schedule follow up with me in 1 month  --Please schedule follow up with pharmacy team for Pulmonary function tests at front desk   --I will call you with results of blood work  - Please start Losartan 50 mg each night for your blood pressure   --I will check a B12 level   --For your shoulder, you can go to [facility] anytime to get an x-ray  They  have two locations     Thank you for choosing [facility].   Please call [phone] with any questions about today's appointment.  Please be sure to schedule follow up at the front  desk before you leave today.   Terisa Starr, MD  Family Medicine

## 2020-12-14 LAB — CBC
Hematocrit: 47 % (ref 37.5–51.0)
Hemoglobin: 15.8 g/dL (ref 13.0–17.7)
MCH: 28.6 pg (ref 26.6–33.0)
MCHC: 33.6 g/dL (ref 31.5–35.7)
MCV: 85 fL (ref 79–97)
Platelets: 322 10*3/uL (ref 150–450)
RBC: 5.52 x10E6/uL (ref 4.14–5.80)
RDW: 12.3 % (ref 11.6–15.4)
WBC: 7.8 10*3/uL (ref 3.4–10.8)

## 2020-12-14 LAB — LIPID PANEL
Chol/HDL Ratio: 10.5 ratio — ABNORMAL HIGH (ref 0.0–5.0)
Cholesterol, Total: 304 mg/dL — ABNORMAL HIGH (ref 100–199)
HDL: 29 mg/dL — ABNORMAL LOW (ref >39)
LDL Chol Calc (NIH): 160 mg/dL — ABNORMAL HIGH (ref 0–99)
Triglycerides: 577 mg/dL (ref 0–149)
VLDL Cholesterol Cal: 115 mg/dL — ABNORMAL HIGH (ref 5–40)

## 2020-12-14 LAB — COMPREHENSIVE METABOLIC PANEL
ALT: 34 IU/L (ref 0–44)
AST: 23 IU/L (ref 0–40)
Albumin/Globulin Ratio: 1.8 (ref 1.2–2.2)
Albumin: 4.6 g/dL (ref 4.0–5.0)
Alkaline Phosphatase: 125 IU/L — ABNORMAL HIGH (ref 44–121)
BUN/Creatinine Ratio: 13 (ref 9–20)
BUN: 12 mg/dL (ref 6–20)
Bilirubin Total: 0.3 mg/dL (ref 0.0–1.2)
CO2: 20 mmol/L (ref 20–29)
Calcium: 7.4 mg/dL — ABNORMAL LOW (ref 8.7–10.2)
Chloride: 100 mmol/L (ref 96–106)
Creatinine, Ser: 0.92 mg/dL (ref 0.76–1.27)
Globulin, Total: 2.5 g/dL (ref 1.5–4.5)
Glucose: 423 mg/dL — ABNORMAL HIGH (ref 65–99)
Potassium: 4.4 mmol/L (ref 3.5–5.2)
Sodium: 137 mmol/L (ref 134–144)
Total Protein: 7.1 g/dL (ref 6.0–8.5)
eGFR: 113 mL/min/{1.73_m2} (ref >59)

## 2020-12-14 LAB — HEPATITIS B SURFACE ANTIGEN: Hepatitis B Surface Ag: NEGATIVE

## 2020-12-14 LAB — VITAMIN B12: Vitamin B-12: 458 pg/mL (ref 232–1245)

## 2020-12-14 LAB — HCV INTERPRETATION

## 2020-12-14 LAB — HCV AB W REFLEX TO QUANT PCR: HCV Ab: <0.1 s/co ratio (ref 0.0–0.9)

## 2020-12-14 LAB — HIV ANTIBODY (ROUTINE TESTING W REFLEX): HIV Screen 4th Generation wRfx: NONREACTIVE

## 2020-12-15 ENCOUNTER — Encounter: Payer: Self-pay | Admitting: Family Medicine

## 2020-12-15 ENCOUNTER — Telehealth: Payer: Self-pay | Admitting: Family Medicine

## 2020-12-15 DIAGNOSIS — G8929 Other chronic pain: Secondary | ICD-10-CM

## 2020-12-15 NOTE — Telephone Encounter (Signed)
Attempted to call patient, unable to reach her. Will send information via mychart.

## 2020-12-21 ENCOUNTER — Ambulatory Visit
Admission: RE | Admit: 2020-12-21 | Discharge: 2020-12-21 | Disposition: A | Discharge status: Home / Self Care | Payer: BC Managed Care – PPO | Source: Ambulatory Visit | Attending: Family Medicine | Admitting: Family Medicine

## 2020-12-21 ENCOUNTER — Other Ambulatory Visit: Payer: Self-pay

## 2020-12-21 ENCOUNTER — Ambulatory Visit: Payer: BC Managed Care – PPO | Admitting: Pharmacist

## 2020-12-21 ENCOUNTER — Encounter: Payer: Self-pay | Admitting: Pharmacist

## 2020-12-21 VITALS — BP 132/92

## 2020-12-21 DIAGNOSIS — Z8489 Family history of other specified conditions: Secondary | ICD-10-CM | POA: Diagnosis not present

## 2020-12-21 DIAGNOSIS — E11319 Type 2 diabetes mellitus with unspecified diabetic retinopathy without macular edema: Secondary | ICD-10-CM | POA: Diagnosis not present

## 2020-12-21 DIAGNOSIS — I10 Essential (primary) hypertension: Secondary | ICD-10-CM | POA: Diagnosis not present

## 2020-12-21 DIAGNOSIS — G8929 Other chronic pain: Secondary | ICD-10-CM

## 2020-12-21 DIAGNOSIS — J4520 Mild intermittent asthma, uncomplicated: Secondary | ICD-10-CM | POA: Insufficient documentation

## 2020-12-21 DIAGNOSIS — R059 Cough, unspecified: Secondary | ICD-10-CM

## 2020-12-21 MED ORDER — TRULICITY 0.75 MG/0.5ML ~~LOC~~ SOAJ: 0.7500 mg | SUBCUTANEOUS | 0 refills | Status: DC

## 2020-12-21 MED ORDER — BUDESONIDE-FORMOTEROL FUMARATE 80-4.5 MCG/ACT IN AERO: 2.0000 | INHALATION_SPRAY | Freq: Every day | RESPIRATORY_TRACT | 3 refills | Status: DC

## 2020-12-21 NOTE — Assessment & Plan Note (Signed)
Patient reports chronic cough, history of asthma as a child and also reports breathing is 6-8/10 with 10 being best breathing in recent memory.  Some chest tightness reported which subjectively improved post nebulization.  Currently not prescribed / taking any asthma medications.  Spirometry evaluation with pre- and post-bronchodilator reveals restriction which was not reversible on post evaluation.  Most likely related to effort/technique.  -Initiated trial of Symbicort to be used as maintenance and reliever therapy (MART).  -Educated patient on purpose, proper use, potential adverse effects including the risk of esophageal candidiasis and need to rinse mouth after each use.  -Reviewed results of pulmonary function tests.

## 2020-12-21 NOTE — Patient Instructions (Addendum)
It was nice to see you today!  Your goal blood sugar is 80-130 before eating and less than 180 after eating. Your goal blood pressure is <130/80.   Medication Changes: Begin Trulicity (dulaglutide) 0.75 mg weekly (follow up with Boykin Reaper about starting) Begin Symbicort 2 puffs daily  Continue Lantus (insulin glargine) 10 units SQ daily, Losartan 50 mg daily, Flonase daily  Monitor blood sugars at home and keep a log (glucometer or piece of paper) to bring with you to your next visit. Please let us know at your next visit how your breathing has been since starting Symbicort.   Follow up with Rachelle Kelley/Dr. Manson Passey in 1 month.

## 2020-12-21 NOTE — Assessment & Plan Note (Signed)
Hypertension longstanding currently above goal but much improved from last office visit.  Blood pressure goal <130/80 mmHg. Medication adherence appears optimal.   1. Continue losartan 50mg  daily and will re-check blood pressure at next office visit. If still elevated will consider addition of HCTZ. 2. Extensively discussed pathophysiology of blood pressure, dietary effects on blood pressure control, and recommended lifestyle interventions

## 2020-12-21 NOTE — Progress Notes (Signed)
Subjective:    Patient ID: Carl [name], adult    DOB: [birth year], 35 y.o.   MRN: [id number]  HPI Patient is a 35 y.o. male who presents for diabetes management. She is in good spirits and presents without assistance. Patient was referred and last seen by Primary Care Provider on 12/13/20.  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 Patient reports adherence with medications.  Do you feel that your medications are working for you?  no  Have you been experiencing any side effects to the medications prescribed? no  Do you have any problems obtaining medications due to transportation or finances?  no     Patient reported dietary habits:  Eats 3 meals/day and 1 snacks/day Breakfast: protein shake Lunch: low carb frozen meals Dinner: sushi  Snacks: chips Drinks: water; diet soda  Patient-reported exercise habits: Cardio (bike) at least 30 minutes with goal of getting to 3x/week   Patient denies hypoglycemic events. Patient denies polyuria (increased urination).  Patient denies polyphagia (increased appetite).  Patient denies polydipsia (increased thirst).  Patient denies neuropathy (nerve pain). Patient denies visual changes. Patient reports self foot exams.   Home fasting blood sugars: has only checked for past three days and reports 326 and highest 426.   Objective:   Labs:   Physical Exam Neurological:     Mental Status: She is alert and oriented to person, place, and time.    Lab  Results  Component Value Date   HGBA1C 11.4 (A) 12/13/2020   HGBA1C 12.9 (A) 11/07/2018   HGBA1C 13.3 (A) 01/14/2018    Vitals:   12/21/20 0903  BP: (!) 132/92    No results found for: MICRALBCREAT  Lipid Panel     Component Value Date/Time   CHOL 304 (H) 12/13/2020 0917   TRIG 577 (HH) 12/13/2020 0917   HDL 29 (L) 12/13/2020 0917   CHOLHDL 10.5 (H) 12/13/2020 0917   LDLCALC 160 (H) 12/13/2020 0917    Clinical Atherosclerotic Cardiovascular Disease (ASCVD): No  The ASCVD Risk score 02/12/2021 DC Jr., et al., 2013) failed to calculate for the following reasons:   The 2013 ASCVD risk score is only valid for ages 9 to 5    Assessment/Plan:   T2DM is not controlled likely due to patient being off medication for years and just recently re-starting insulin therapy. Medication adherence appears optimal. Additional pharmacotherapy is warranted. Will initiate Trulicity 0.75mg  once weekly. Patient has family history of benign thyroid tumor in her mother. Patient unaware of exact tumor present and mother has since deceased but will attempt to obtain records. Patient educated on purpose, proper use and potential adverse effects of Trulicity. In the future will add SGLT, likely 76 as patient reports previously tolerating well, but want to lower blood glucose more before initiating. Following instruction patient verbalized understanding of treatment plan.    Continued basal insulin glargine (Lantus) 10 units daily Started GLP-1 Trulicity 0.75mg  once weekly Extensively discussed pathophysiology of diabetes, dietary effects on blood sugar control, and recommended lifestyle interventions. Patient will exercise 3x/week with goal to increase towards target of at least  150 minutes of moderate intensity exercise weekly Counseled on s/sx of and management of hypoglycemia Next A1C anticipated November 2022.   Hypertension longstanding currently above goal but much improved from last office visit.   Blood pressure goal <130/80 mmHg. Medication adherence appears optimal.   Continue losartan 50mg  daily and will re-check blood pressure at next office visit. If still elevated will consider addition of HCTZ. Extensively discussed pathophysiology of blood pressure, dietary effects on blood pressure control, and recommended lifestyle interventions  Follow-up appointment 2 weeks to review sugar readings. Written patient instructions provided.  This appointment required 40 minutes of direct patient care (this includes precharting, chart review, review of results, and face-to-face care).  Thank you for involving pharmacy to assist in providing this patient's care.  Patient seen with , PharmD Candidate, and Shellia Carwin, PharmD - PGY-1 Resident.

## 2020-12-21 NOTE — Assessment & Plan Note (Signed)
T2DM is not controlled likely due to patient being off medication for years and just recently re-starting insulin therapy. Medication adherence appears optimal. Additional pharmacotherapy is warranted. Will initiate Trulicity 0.75mg  once weekly. Patient has family history of benign thyroid tumor in her mother. Patient unaware of exact tumor present and mother has since deceased but will attempt to obtain records. Patient educated on purpose, proper use and potential adverse effects of Trulicity. In the future will add SGLT, likely [place] as patient reports previously tolerating well, but want to lower blood glucose more before initiating. Following instruction patient verbalized understanding of treatment plan.    1. Continued basal insulin glargine (Lantus) 10 units daily 2. Started GLP-1 Trulicity 0.75mg  once weekly 3. Extensively discussed pathophysiology of diabetes, dietary effects on blood sugar control, and recommended lifestyle interventions. 4. Patient will exercise 3x/week with goal to increase towards target of at least 150 minutes of moderate intensity exercise weekly 5. Counseled on s/sx of and management of hypoglycemia 6. Next A1C anticipated November 2022.

## 2020-12-21 NOTE — Progress Notes (Addendum)
S:    Patient arrives in good spirits and does not need assistance ambulating.   Presents for lung function evaluation. Patient was referred and last seen by Primary Care Provider, Dr. Manson Passey on 12/13/20.   Patient reports breathing has been unchanged over the past few months. Today, breathlessness is 8/10 (baseline 9/10 or 10/10) and chest tightness is 6-7/10. Still having dry cough and chest tightness. Patient reports long history of allergic rhinitis.   Patient reports experiencing dry cough for years. Initially thought it was ACE-I related; however, after DC'ing lisinopril, the dry cough continued. Singing makes cough worse. Reports constant tightness in chest. These symptoms have worsened since finishing college. No change in breathing since moving here from [facility] in February 2022.  Endorses having an albuterol inhaler as a child in elementary school and used PRN, unsure when stopped taking. Likely high school (potentially stopped d/t insurance coverage?). Not currently on any inhalers. Reports parents smoked while she was growing up. She started smoking briefly in 2007 and smoked for a few months. It has been years since her last cigarette. She does not live with anyone who currently smokes.   Factors that worsen breathing: singing, air blowing into face, walking far in a parking lot Factors that improve breathing: resting, deep breathing  Patient reports adherence to medications Patient reports last dose of asthma medications was Flonase (yesterday) Current asthma medications: Flonase, Zyrtec Rescue inhaler use frequency: N/a  Level of asthma sx control- in the last 4 weeks: Question Scoring Patient Score  Daytime sx more than twice a week Yes (1) No (0) 1  Any nighttime waking due to asthma Yes (1) No (0) 0  Reliever needed >2x/week Yes (1) No (0) 0  Any activity limitation due to asthma Yes (1) No (0) 0   Total Score 1  Well controlled - 0, partly controlled - 1-2, uncontrolled  3-4   O: Physical Exam Constitutional:      Appearance: Normal appearance.  Pulmonary:     Effort: Pulmonary effort is normal.  Neurological:     Mental Status: She is alert.  Psychiatric:        Mood and Affect: Mood normal.        Behavior: Behavior normal.    Review of Systems  Respiratory:  Positive for cough and shortness of breath.   All other systems reviewed and are negative.   See "scanned report" or Documentation Flowsheet (discrete results - PFTs) for  Spirometry results. Patient provided good effort while attempting spirometry.   Lung Age = 82 Albuterol Neb  Lot# [id number]     Exp. 12/2021   A/P: Patient reports chronic cough, history of asthma as a child and also reports breathing is 6-8/10 with 10 being best breathing in recent memory.  Some chest tightness reported which subjectively improved post nebulization.  Currently not prescribed / taking any asthma medications.  Spirometry evaluation with pre- and post-bronchodilator reveals restriction which was not reversible on post evaluation.  Most likely related to effort/technique.  -Initiated trial of Symbicort to be used as maintenance and reliever therapy (MART).  -Educated patient on purpose, proper use, potential adverse effects including the risk of esophageal candidiasis and need to rinse mouth after each use.  -Reviewed results of pulmonary function tests.   Patient verbalized understanding of results and education.  Written pt instructions provided.   F/U Rx Clinic visit with Cheral Almas and PCP, Dr. Manson Passey.    Total time in face to face counseling  44 minutes.  Patient seen with Shellia Carwin, PharmD Candidate, and Valeda Malm, PharmD - PGY-1 Resident.  .    Symbicort prescription sent to alternate pharmacy for use with Good Rx card.  Sent to [facility]

## 2020-12-21 NOTE — Progress Notes (Signed)
Reviewed: I agree with Dr. Koval's documentation and management. 

## 2020-12-21 NOTE — Patient Instructions (Signed)
[name] it was a pleasure seeing you today!   Please do the following:  Start Trulicity 0.75mg  one time a week on the same day each week as directed today during your appointment. If you have any questions or if you believe something has occurred because of this change, call me or your doctor to let one of [place] know.  Continue checking blood sugars at home. It's really important that you record these and bring these in to your next doctor's appointment.  Continue making the lifestyle changes we've discussed together during our visit. Diet and exercise play a significant role in improving your blood sugars.  Follow-up with me in one week.    Hypoglycemia or low blood sugar:   Low blood sugar can happen quickly and may become an emergency if not treated right away.   While this shouldn't happen often, it can be brought upon if you skip a meal or do not eat enough. Also, if your insulin or other diabetes medications are dosed too high, this can cause your blood sugar to go to low.   Warning signs of low blood sugar include: Feeling shaky or dizzy Feeling weak or tired  Excessive hunger Feeling anxious or upset  Sweating even when you aren't exercising  What to do if I experience low blood sugar? Follow the Rule of 15 Check your blood sugar with your meter. If lower than 70, proceed to step 2.  Treat with 15 grams of fast acting carbs which is found in 3-4 glucose tablets. If none are available you can try hard candy, 1 tablespoon of sugar or honey,4 ounces of fruit juice, or 6 ounces of REGULAR soda.  Re-check your sugar in 15 minutes. If it is still below 70, do what you did in step 2 again. If your blood sugar has come back up, go ahead and eat a snack or small meal made up of complex carbs (ex. Whole grains) and protein at this time to avoid recurrence of low blood sugar.

## 2020-12-22 ENCOUNTER — Telehealth: Payer: Self-pay | Admitting: Family Medicine

## 2020-12-22 LAB — BASIC METABOLIC PANEL
BUN/Creatinine Ratio: 17 (ref 9–20)
BUN: 16 mg/dL (ref 6–20)
CO2: 23 mmol/L (ref 20–29)
Calcium: 10.3 mg/dL — ABNORMAL HIGH (ref 8.7–10.2)
Chloride: 98 mmol/L (ref 96–106)
Creatinine, Ser: 0.93 mg/dL (ref 0.76–1.27)
Glucose: 393 mg/dL — ABNORMAL HIGH (ref 65–99)
Potassium: 5 mmol/L (ref 3.5–5.2)
Sodium: 139 mmol/L (ref 134–144)
eGFR: 111 mL/min/{1.73_m2} (ref >59)

## 2020-12-22 NOTE — Telephone Encounter (Signed)
Attempted to call x1.  Left generic voicemail to call back   If patient calls back shoulder x-ray shows some mild arthritis but no significant injury.  I would like to discuss physical therapy with her and potentially seeing sports medicine   Kidney function and electrolytes look overall good.  We will keep an eye on her potassium.  Her sugars improving  Terisa Starr, MD  Family Medicine Teaching Service

## 2020-12-23 MED ORDER — FREESTYLE LIBRE 2 SENSOR MISC
2 refills | Status: DC
Start: 2020-12-23 — End: 2021-03-16

## 2020-12-23 NOTE — Addendum Note (Signed)
Addended by: Cheral Almas on: 12/23/2020 11:48 AM   Modules accepted: Orders

## 2020-12-28 ENCOUNTER — Telehealth: Payer: BC Managed Care – PPO | Admitting: Physician Assistant

## 2020-12-28 DIAGNOSIS — K529 Noninfective gastroenteritis and colitis, unspecified: Secondary | ICD-10-CM

## 2020-12-28 DIAGNOSIS — G43819 Other migraine, intractable, without status migrainosus: Secondary | ICD-10-CM | POA: Diagnosis not present

## 2020-12-28 MED ORDER — BUDESONIDE-FORMOTEROL FUMARATE 80-4.5 MCG/ACT IN AERO: 2.0000 | INHALATION_SPRAY | Freq: Every day | RESPIRATORY_TRACT | 3 refills | Status: DC

## 2020-12-28 MED ORDER — ONDANSETRON 4 MG PO TBDP
4.0000 mg | ORAL_TABLET | Freq: Three times a day (TID) | ORAL | 0 refills | Status: DC | PRN
Start: 2020-12-28 — End: 2021-08-01

## 2020-12-28 MED ORDER — SUMATRIPTAN SUCCINATE 25 MG PO TABS: 25.0000 mg | ORAL_TABLET | Freq: Once | ORAL | 0 refills | Status: DC

## 2020-12-28 NOTE — Addendum Note (Signed)
Addended by: Manson Passey, Melania Kirks on: 12/28/2020 09:01 AM   Modules accepted: Orders

## 2020-12-28 NOTE — Patient Instructions (Signed)
Wynn Banker, thank you for joining Leeanne Rio, PA-C for today's virtual visit.  While this provider is not your primary care provider (PCP), if your PCP is located in our provider database this encounter information will be shared with them immediately following your visit.  Consent: (Patient) Carl [name] provided verbal consent for this virtual visit at the beginning of the encounter.  Current Medications:  Current Outpatient Medications:    Accu-Chek Softclix Lancets lancets, Use as instructed, Disp: 100 each, Rfl: 12   Blood Glucose Monitoring Suppl (ACCU-CHEK AVIVA PLUS) w/Device KIT, Use to check blood glucose once daily before injecting insulin, Disp: 1 kit, Rfl: 0   budesonide-formoterol (SYMBICORT) 80-4.5 MCG/ACT inhaler, Inhale 2 puffs into the lungs daily., Disp: 1 each, Rfl: 3   cetirizine (ZYRTEC) 10 MG tablet, Take 10 mg by mouth daily., Disp: , Rfl:    Continuous Blood Gluc Sensor (FREESTYLE LIBRE 2 SENSOR) MISC, Use as directed to check blood glucose. Replace sensor every 14 days., Disp: 2 each, Rfl: 2   Dulaglutide (TRULICITY) 1.24 PY/0.9XI SOPN, Inject 0.75 mg into the skin once a week., Disp: 2 mL, Rfl: 0   esomeprazole (NEXIUM) 20 MG capsule, Take 20 mg by mouth daily at 12 noon., Disp: , Rfl:    glucose blood (ACCU-CHEK ACTIVE STRIPS) test strip, Use as instructed, Disp: 100 each, Rfl: 12   glucose blood test strip, Use as instructed, Disp: 100 each, Rfl: 1   ibuprofen (ADVIL) 800 MG tablet, Take 200 mg by mouth every 8 (eight) hours as needed., Disp: , Rfl:    insulin glargine (LANTUS) 100 UNIT/ML Solostar Pen, Inject 10 Units into the skin daily., Disp: 15 mL, Rfl: 1   Insulin Pen Needle (NOVOFINE) 30G X 8 MM MISC, Use to inject insulin daily, Disp: 100 each, Rfl: 11   losartan (COZAAR) 50 MG tablet, Take 1 tablet (50 mg total) by mouth at bedtime., Disp: 90 tablet, Rfl: 3   Multiple Vitamins-Minerals (MULTIVITAMIN WITH MINERALS) tablet, Take 1 tablet  by mouth daily., Disp: , Rfl:    Medications ordered in this encounter:  No orders of the defined types were placed in this encounter.    *If you need refills on other medications prior to your next appointment, please contact your pharmacy*  Follow-Up: Call back or seek an in-person evaluation if the symptoms worsen or if the condition fails to improve as anticipated.  Other Instructions Please keep well-hydrated and get plenty of rest. Take the zofran as directed for nausea and vomiting.  Once you feel up to is you can start the diet below to ease you back in to a more regular diet.  You can use Immodium if needed to help calm down frequency of BM.  The Imitrex is to be used no more than as directed to abort headache.   If symptoms are not resolving or if you become unable to tolerate fluids by mouth, you need ER evaluation.    If you have been instructed to have an in-person evaluation today at a local Urgent Care facility, please use the link below. It will take you to a list of all of our available [facility], including address, phone number and hours of operation. Please do not delay care.  [facility]  If you or a family member do not have a primary care provider, use the link below to schedule a visit and establish care. When you choose a [facility] primary care  physician or advanced practice provider, you gain a long-term partner in health. Find a Primary Care Provider  Learn more about [facility]'s in-office and virtual care options: [facility]

## 2020-12-28 NOTE — Addendum Note (Signed)
Addended by: Kathrin Ruddy on: 12/28/2020 01:39 PM   Modules accepted: Orders

## 2020-12-28 NOTE — Progress Notes (Signed)
Virtual Visit Consent   Carl [name], you are scheduled for a virtual visit with a [facility] provider today.     Just as with appointments in the office, your consent must be obtained to participate.  Your consent will be active for this visit and any virtual visit you may have with one of our providers in the next 365 days.     If you have a MyChart account, a copy of this consent can be sent to you electronically.  All virtual visits are billed to your insurance company just like a traditional visit in the office.    As this is a virtual visit, video technology does not allow for your provider to perform a traditional examination.  This may limit your provider's ability to fully assess your condition.  If your provider identifies any concerns that need to be evaluated in person or the need to arrange testing (such as labs, EKG, etc.), we will make arrangements to do so.     Although advances in technology are sophisticated, we cannot ensure that it will always work on either your end or our end.  If the connection with a video visit is poor, the visit may have to be switched to a telephone visit.  With either a video or telephone visit, we are not always able to ensure that we have a secure connection.     I need to obtain your verbal consent now.   Are you willing to proceed with your visit today?    [name] has provided verbal consent on 12/28/2020 for a virtual visit (video or telephone).   Carl [name], [place]   Date: 12/28/2020 8:41 AM   Virtual Visit via Video Note   I, Carl [name], connected with  Carl [name]  ([id number], [birth year]) on 12/28/20 at  8:15 AM EDT by a video-enabled telemedicine application and verified that I am speaking with the correct person using two identifiers.  Location: Patient: Virtual Visit Location Patient: Home Provider: Virtual Visit Location Provider: Home Office   I discussed the limitations of evaluation and  management by telemedicine and the availability of in person appointments. The patient expressed understanding and agreed to proceed.    History of Present Illness: Carl [name], preferred name Carl [name], is a 35 y.o. who identifies as a male who was assigned male at birth, and is being seen today for migraine headache x 1.5 days. She has noted an initial unilateral headache that has now become bilateral with photophobia, nausea and vomiting. Notes history of migraine but has not had one lasting like this in some time. Has taken Excedrin which helped but did not abort headache. Has also noted diarrhea since last night, endorsing frequent loose stool. Denies melena, hematochezia or tenesmus. Denies fevers, URI symptoms. Denies hematemesis. Is able to tolerate PO fluids.   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   Dyslipidemia 02/15/2018   Diabetes ([place]) 01/14/2018   Essential hypertension 01/14/2018   Prefers Male Pronouns--- [name]  01/14/2018    Allergies:  Allergies  Allergen Reactions   Lisinopril Cough   Metformin And Related Diarrhea    Has tried multiple times without success    Medications:  Current Outpatient Medications:    Accu-Chek Softclix Lancets  lancets, Use as instructed, Disp: 100 each, Rfl: 12   Blood Glucose Monitoring Suppl (ACCU-CHEK AVIVA PLUS) w/Device KIT, Use to check blood glucose once daily before injecting insulin, Disp: 1 kit, Rfl: 0   budesonide-formoterol (SYMBICORT) 80-4.5 MCG/ACT inhaler, Inhale 2 puffs into the lungs daily. (Patient not taking: Reported on 12/28/2020), Disp: 1 each, Rfl: 3   cetirizine (ZYRTEC) 10 MG tablet, Take 10 mg by mouth daily., Disp: , Rfl:    Continuous Blood Gluc Sensor (FREESTYLE LIBRE 2 SENSOR) MISC, Use as directed to check blood  glucose. Replace sensor every 14 days., Disp: 2 each, Rfl: 2   Dulaglutide (TRULICITY) 9.79 YI/0.1KP SOPN, Inject 0.75 mg into the skin once a week. (Patient not taking: Reported on 12/28/2020), Disp: 2 mL, Rfl: 0   esomeprazole (NEXIUM) 20 MG capsule, Take 20 mg by mouth daily at 12 noon., Disp: , Rfl:    glucose blood (ACCU-CHEK ACTIVE STRIPS) test strip, Use as instructed, Disp: 100 each, Rfl: 12   glucose blood test strip, Use as instructed, Disp: 100 each, Rfl: 1   ibuprofen (ADVIL) 800 MG tablet, Take 200 mg by mouth every 8 (eight) hours as needed., Disp: , Rfl:    insulin glargine (LANTUS) 100 UNIT/ML Solostar Pen, Inject 10 Units into the skin daily., Disp: 15 mL, Rfl: 1   Insulin Pen Needle (NOVOFINE) 30G X 8 MM MISC, Use to inject insulin daily, Disp: 100 each, Rfl: 11   losartan (COZAAR) 50 MG tablet, Take 1 tablet (50 mg total) by mouth at bedtime., Disp: 90 tablet, Rfl: 3   Multiple Vitamins-Minerals (MULTIVITAMIN WITH MINERALS) tablet, Take 1 tablet by mouth daily., Disp: , Rfl:   Observations/Objective: Patient is well-developed, well-nourished in no acute distress.  Resting comfortably at home.  Head is normocephalic, atraumatic.  No labored breathing. Speech is clear and coherent with logical content.  Patient is alert and oriented at baseline.   Assessment and Plan: 1. Gastroenteritis  2. Other migraine without status migrainosus, intractable Symptoms seem consistent with her typical migraine, likely triggered by developing viral gastroenteritis. Thankfully no alarm signs/symptoms present. Supportive measures discussed. BRAT diet recommended. Can use Imodium if needed for loose stool. Will add on Zofran ODT 4 mg for nausea/vomiting. Rx Imitrex to abort migraine. Strict ER precautions reviewed with patient. Work note provided.   Follow Up Instructions: I discussed the assessment and treatment plan with the patient. The patient was provided an opportunity to ask questions  and all were answered. The patient agreed with the plan and demonstrated an understanding of the instructions.  A copy of instructions were sent to the patient via MyChart.  The patient was advised to call back or seek an in-person evaluation if the symptoms worsen or if the condition fails to improve as anticipated.  Time:  I spent 15 minutes with the patient via telehealth technology discussing the above problems/concerns.    Carl [place], PA-C

## 2020-12-31 ENCOUNTER — Telehealth (INDEPENDENT_AMBULATORY_CARE_PROVIDER_SITE_OTHER): Payer: BC Managed Care – PPO | Admitting: Pharmacist

## 2020-12-31 ENCOUNTER — Other Ambulatory Visit: Payer: Self-pay

## 2020-12-31 DIAGNOSIS — E11319 Type 2 diabetes mellitus with unspecified diabetic retinopathy without macular edema: Secondary | ICD-10-CM | POA: Diagnosis not present

## 2020-12-31 NOTE — Progress Notes (Signed)
Subjective:    Patient ID: Carl Carl [name], adult    DOB: Dec 19, 1985, 35 y.o.   MRN: [id number]  HPI Patient is a 35 y.o. male who presents for diabetes management. She is in good spirits and presents without assistance. Patient was referred and last seen by Primary Care Provider on 12/13/20.  I connected with patient by a video enabled telemedicine application and verified that I am speaking with the correct person using two identifiers.   I discussed the limitations of evaluation and management by telemedicine. The patient expressed understanding and agreed to proceed. Patient located at work and provider located at remote office.  Patient reports taking first dose of Trulicity yesterday. Patient confirmed previous family history regarding thyroid cancer was not medullary thyroid cancer therefore appropriate to continue on medication. Patient states she has had some nausea but that she has been getting over an illness so was unclear if it was lingering from the illness or new onset nausea from the Trulicity, but reports it is tolerable. Patient was able to obtain Libre CGM and was able to download app successfully and has been using to Carl [name] her blood glucose readings, which she reports have still been high.  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 Patient reports adherence with medications.  Do you feel that your medications are working for you?  no  Have you been experiencing any side effects to the medications prescribed? no  Do you have any problems obtaining medications due to transportation or finances?  no    Patient reported  dietary habits:  Eats 3 meals/day and 1 snacks/day Breakfast: protein shake or fiber 1 bars Lunch: low carb frozen meals Dinner: low carb frozen meals Snacks: sometimes yes sometimes no (popcorn)  Drinks: water; diet soda  Patient-reported exercise habits: Cardio (bike) at least 30 minutes with goal of getting to 3x/week   Patient denies hypoglycemic events. Patient denies polyuria (increased urination).  Patient denies polyphagia (increased appetite).  Patient denies polydipsia (increased thirst).  Patient denies neuropathy (nerve pain). Patient denies visual changes. Patient reports self foot exams.   Home fasting blood sugars: continues to be in the 300-400's  Objective:   Labs:   Physical Exam Neurological:     Mental Status: She is alert and oriented to person, place, and time.    Lab Results  Component Value Date   HGBA1C 11.4 (A) 12/13/2020   HGBA1C 12.9 (A) 11/07/2018   HGBA1C 13.3 (A) 01/14/2018    There were no vitals filed for this visit.   No results found for: [facility]  Lipid Panel     Component Value Date/Time   CHOL 304 (H) 12/13/2020 0917   TRIG 577 (HH) 12/13/2020 0917   HDL 29 (L) 12/13/2020 0917   CHOLHDL 10.5 (H) 12/13/2020 0917   LDLCALC 160 (H) 12/13/2020 0917    Clinical Atherosclerotic Cardiovascular Disease (ASCVD): No  The ASCVD Risk score 02/12/2021 DC Jr., et al., 2013) failed to calculate for the following reasons:   The 2013 ASCVD risk score is only valid for ages 27 to 61    Assessment/Plan:   T2DM is not controlled likely due to patient being off medication for years and just recently re-starting insulin therapy. Medication adherence appears  optimal. Patient only taken first dose of Trulicity about 24 hours prior therefore too soon to assess full effectiveness of medication. In the meantime will increase patient's Lantus from 10 units to 12 units and patient instructed to titrate up to 14 units prior to next provider appointment  if fasting blood glucose all >200. In the future will add SGLT, likely [place] as patient reports previously tolerating well, but want to lower blood glucose to 200's before initiation. Following instruction patient verbalized understanding of treatment plan.    Increased dose of basal insulin glargine (Lantus) to 12 units daily and titrate up in one week to 14 units if FBG >200. Continued GLP-1 Trulicity 0.75mg  once weekly on Thursdays Extensively discussed pathophysiology of diabetes, dietary effects on blood sugar control, and recommended lifestyle interventions. Patient will exercise 3x/week with goal to increase towards target of at least 150 minutes of moderate intensity exercise weekly Counseled on s/sx of and management of hypoglycemia Next A1C anticipated November 2022.   Follow-up appointment 2 weeks with provider to review sugar readings. Written patient instructions provided.  This appointment required 25 minutes of non-face-to-face direct patient care.  Thank you for involving pharmacy to assist in providing this patient's care.

## 2021-01-03 NOTE — Assessment & Plan Note (Signed)
T2DM is not controlled likely due to patient being off medication for years and just recently re-starting insulin therapy. Medication adherence appears optimal. Patient only taken first dose of Trulicity about 24 hours prior therefore too soon to assess full effectiveness of medication. In the meantime will increase patient's Lantus from 10 units to 12 units and patient instructed to titrate up to 14 units prior to next provider appointment if fasting blood glucose all >200. In the future will add SGLT, likely [place] as patient reports previously tolerating well, but want to lower blood glucose to 200's before initiation. Following instruction patient verbalized understanding of treatment plan.    1. Increased dose of basal insulin glargine (Lantus) to 12 units daily and titrate up in one week to 14 units if FBG >200. 2. Continued GLP-1 Trulicity 0.75mg  once weekly on Thursdays 3. Extensively discussed pathophysiology of diabetes, dietary effects on blood sugar control, and recommended lifestyle interventions. 4. Patient will exercise 3x/week with goal to increase towards target of at least 150 minutes of moderate intensity exercise weekly 5. Counseled on s/sx of and management of hypoglycemia 6. Next A1C anticipated November 2022.   Follow-up appointment 2 weeks with provider to review sugar readings. Written patient instructions provided.

## 2021-01-14 ENCOUNTER — Ambulatory Visit: Payer: BC Managed Care – PPO | Admitting: Family Medicine

## 2021-01-24 MED ORDER — TRULICITY 1.5 MG/0.5ML ~~LOC~~ SOAJ: 1.5000 mg | SUBCUTANEOUS | 0 refills | Status: DC

## 2021-01-24 NOTE — Addendum Note (Signed)
Addended by: Cheral Almas on: 01/24/2021 09:10 AM   Modules accepted: Orders

## 2021-02-04 ENCOUNTER — Other Ambulatory Visit: Payer: Self-pay

## 2021-02-04 ENCOUNTER — Encounter: Payer: Self-pay | Admitting: Family Medicine

## 2021-02-04 ENCOUNTER — Ambulatory Visit (INDEPENDENT_AMBULATORY_CARE_PROVIDER_SITE_OTHER): Payer: BC Managed Care – PPO | Admitting: Family Medicine

## 2021-02-04 VITALS — BP 128/93 | HR 105 | Wt 235.2 lb

## 2021-02-04 DIAGNOSIS — Z23 Encounter for immunization: Secondary | ICD-10-CM

## 2021-02-04 DIAGNOSIS — G629 Polyneuropathy, unspecified: Secondary | ICD-10-CM | POA: Diagnosis not present

## 2021-02-04 DIAGNOSIS — E785 Hyperlipidemia, unspecified: Secondary | ICD-10-CM

## 2021-02-04 DIAGNOSIS — M25612 Stiffness of left shoulder, not elsewhere classified: Secondary | ICD-10-CM | POA: Insufficient documentation

## 2021-02-04 DIAGNOSIS — Z113 Encounter for screening for infections with a predominantly sexual mode of transmission: Secondary | ICD-10-CM | POA: Diagnosis not present

## 2021-02-04 DIAGNOSIS — I10 Essential (primary) hypertension: Secondary | ICD-10-CM

## 2021-02-04 DIAGNOSIS — E11319 Type 2 diabetes mellitus with unspecified diabetic retinopathy without macular edema: Secondary | ICD-10-CM | POA: Diagnosis not present

## 2021-02-04 DIAGNOSIS — J4520 Mild intermittent asthma, uncomplicated: Secondary | ICD-10-CM

## 2021-02-04 MED ORDER — ALBUTEROL SULFATE HFA 108 (90 BASE) MCG/ACT IN AERS: 2.0000 | INHALATION_SPRAY | Freq: Four times a day (QID) | RESPIRATORY_TRACT | 3 refills | Status: DC | PRN

## 2021-02-04 MED ORDER — AMLODIPINE BESYLATE 5 MG PO TABS: 5.0000 mg | ORAL_TABLET | Freq: Every day | ORAL | 3 refills | Status: DC

## 2021-02-04 NOTE — Progress Notes (Signed)
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    HTN Has been measuring BP intermittently at work. Values in 140-160 range. Diastolic also usually in 90s. Denies chest pain, LE edema, orthopnea. Has been taking ARB without issue.   Neuropathy Onset suddenly approximately 1 year ago. This is located from mid thigh down. Feels likes an odd blanket/numb sensation. Has always had high arch. Has noticed weakness and atrophy of L side over past few months. No falls, no back pain, no incontinence, no hand symptoms. B12 normal. She does have a long standing history of elevated A1C.  Denies trauma to area. No prior back surgeries. Has a family history of neuropathy.   Type 2 diabetes, not at goal, on insulin. She brings with her CGM readings which show BG ranging from 76-160s at highest. No symptoms of being low. She does not have the clinics number to transmit values to Dr. Nicholaus Bloom. She is taking Trulicity and insulin glargine 14 U. She is on ARB.   PREP  Patient is interested in dating again. She would like to go back on PREP. Has not had intercourse in many months.  Bilateral shoulder pain Has had 1 year of reduced ROM of L shoulder. 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 Has noticed ROM continues to be reduced and has pain over L deltoid at times. She has a history of ulnar neuropathy, which is improving. Frozen shoulder has not. Xray did not show significant arthritis. She now has some reduce ROM on right. No prior injections or trauma. Was referred to PT and SM at last visit.   PERTINENT  PMH / PSH/Family/Social History : Updated and reviewed---see tabs and problem list   OBJECTIVE:   BP (!) 128/93   Pulse (!) 105   Wt 235 lb 3.2 oz (106.7 kg)   SpO2 98%   BMI 33.75 kg/m   Today's weight:  Last Weight  Most recent update:  02/04/2021  9:14 AM    Weight  106.7 kg (235 lb 3.2 oz)            Review of prior weights: [employer]   02/04/21 0913  Weight: 235 lb 3.2 oz (106.7 kg)    Cardiac: Regular rate and rhythm. Normal S1/S2. No murmurs, rubs, or gallops appreciated. Lungs: Clear bilaterally to ascultation.  Decreased sensation from medial tibia distally + hair changes + pez cavus bilaterally Atrophy of calf Has foot drop on L when walking (noted over past few months) Weakness of great toe dorsiflexion and foot dorsiflexion on L as compared to right   ASSESSMENT/PLAN:   PREP Therapy  Baseline labs today. If HIV negative and labs appropriate, start PREP. Will also discuss MPV vaccine.    Essential hypertension Not at goal, added amlodipine, follow up for monitoring with Dr. Nicholaus Bloom and PCP.   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 Referral to Neurology. Question if CMT given high arch to feed and muscle changes. There is also a family history. B12 normal. Sent TSH, RPR, and hepatitis serologies.   Dyslipidemia Repeat direct LDL today.   Intermittent asthma Rx for albuterol as could not afford symbicort Sample of Breo 100 given  Discussed how to use and reasons to return care   Impaired range  of motion of left shoulder Previously referred to Sports Medicine- gave number to call  Discussed PT exercises at prior visit R side now also bothersome  Consider CK at follow up  Leading differential is frozen shoulder. Less likely C-spine etiology given ROM is impaired. Limited active and passive range of motion of <90 degrees on L, just to 90 on R.   Type 2 DM Congratulated on changes A1C at follow up Agree with addition of SLGT2 at follow up- BMP today   HCM Flu, Tdap, and PCV20 today    Terisa Starr, MD  Family Medicine Teaching Service  [facility]

## 2021-02-04 NOTE — Assessment & Plan Note (Signed)
Previously referred to Sports Medicine- gave number to call  Discussed PT exercises at prior visit R side now also bothersome

## 2021-02-04 NOTE — Patient Instructions (Signed)
It was wonderful to see you today.  Please bring ALL of your medications with you to every visit.   Today we talked about:  Please call [phone] about your shoulder visit   -- You will be called by Neurology about your neuropathy  - If your labs are negative--I will send in Truvada for PREP  - START amlodipine for high blood pressure  - I will ask Durward Mallard about alternative inhalers  - I sent in albuterol for you  - I will check with Dr. Nicholaus Bloom about the number to upload your CGM numbers  CONGRATULATIONS ON YOUR GREAT SUGAR LEVELS!!!!   Follow up in November    Thank you for choosing [facility].   Please call [phone] with any questions about today's appointment.  Please be sure to schedule follow up at the front  desk before you leave today.   Terisa Starr, MD  Family Medicine

## 2021-02-04 NOTE — Assessment & Plan Note (Signed)
Rx for albuterol as could not afford symbicort Sample of Breo 100 given  Discussed how to use and reasons to return care

## 2021-02-04 NOTE — Assessment & Plan Note (Addendum)
Baseline labs today. If HIV negative and labs appropriate, start PREP. Will also discuss MPV vaccine.

## 2021-02-04 NOTE — Assessment & Plan Note (Signed)
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 Referral to Neurology. Question if CMT given high arch to feed and muscle changes. There is also a family history. B12 normal. Sent TSH, RPR, and hepatitis serologies.

## 2021-02-04 NOTE — Assessment & Plan Note (Signed)
Repeat direct LDL today.  

## 2021-02-04 NOTE — Assessment & Plan Note (Signed)
Not at goal, added amlodipine, follow up for monitoring with Dr. Nicholaus Bloom and PCP.

## 2021-02-05 ENCOUNTER — Other Ambulatory Visit: Payer: Self-pay | Admitting: Family Medicine

## 2021-02-05 DIAGNOSIS — Z9189 Other specified personal risk factors, not elsewhere classified: Secondary | ICD-10-CM

## 2021-02-05 LAB — HEPATITIS B CORE ANTIBODY, TOTAL: Hep B Core Total Ab: NEGATIVE

## 2021-02-05 LAB — BASIC METABOLIC PANEL
BUN/Creatinine Ratio: 16 (ref 9–20)
BUN: 12 mg/dL (ref 6–20)
CO2: 23 mmol/L (ref 20–29)
Calcium: 9.8 mg/dL (ref 8.7–10.2)
Chloride: 105 mmol/L (ref 96–106)
Creatinine, Ser: 0.77 mg/dL (ref 0.76–1.27)
Glucose: 161 mg/dL — ABNORMAL HIGH (ref 70–99)
Potassium: 4.8 mmol/L (ref 3.5–5.2)
Sodium: 142 mmol/L (ref 134–144)
eGFR: 121 mL/min/{1.73_m2} (ref >59)

## 2021-02-05 LAB — HEPATITIS B SURFACE ANTIGEN: Hepatitis B Surface Ag: NEGATIVE

## 2021-02-05 LAB — HIV ANTIBODY (ROUTINE TESTING W REFLEX): HIV Screen 4th Generation wRfx: NONREACTIVE

## 2021-02-05 LAB — LDL CHOLESTEROL, DIRECT: LDL Direct: 133 mg/dL — ABNORMAL HIGH (ref 0–99)

## 2021-02-05 LAB — RPR: RPR Ser Ql: NONREACTIVE

## 2021-02-05 LAB — HEPATITIS B SURFACE ANTIBODY, QUANTITATIVE: Hepatitis B Surf Ab Quant: 69.9 m[IU]/mL (ref >9.9)

## 2021-02-05 LAB — TSH: TSH: 1.62 u[IU]/mL (ref 0.450–4.500)

## 2021-02-05 MED ORDER — EMTRICITABINE-TENOFOVIR DF 200-300 MG PO TABS: 1.0000 | ORAL_TABLET | Freq: Every day | ORAL | 0 refills | Status: DC

## 2021-02-05 NOTE — Progress Notes (Signed)
PREP to pharmacy--HIV negative. Rx for 90 days, messaged via MyChart.

## 2021-02-09 ENCOUNTER — Telehealth: Payer: Self-pay | Admitting: Family Medicine

## 2021-02-09 DIAGNOSIS — J4520 Mild intermittent asthma, uncomplicated: Secondary | ICD-10-CM

## 2021-02-09 NOTE — Telephone Encounter (Signed)
Called and left generic voicemail to call back.

## 2021-02-09 NOTE — Telephone Encounter (Addendum)
Called and discussed results.All questions answered.   Symbicort is still unaffordable. Will see if she may qualify for assistance with this.   Terisa Starr, MD  Family Medicine Teaching Service

## 2021-02-09 NOTE — Telephone Encounter (Signed)
Patient returns call to nurse line requesting to speak with Dr. Manson Passey.   Please advise.   Veronda Prude, RN

## 2021-02-10 MED ORDER — FLUTICASONE FUROATE-VILANTEROL 100-25 MCG/INH IN AEPB: 1.0000 | INHALATION_SPRAY | Freq: Every day | RESPIRATORY_TRACT | 2 refills | Status: DC

## 2021-02-10 NOTE — Addendum Note (Signed)
Addended by: Manson Passey, Edda Orea on: 02/10/2021 04:32 PM   Modules accepted: Orders

## 2021-02-14 ENCOUNTER — Telehealth: Payer: Self-pay

## 2021-02-14 NOTE — Telephone Encounter (Signed)
Left VM for pt regarding possible patient assistance. Gave direct call back number [phone].

## 2021-02-14 NOTE — Telephone Encounter (Signed)
Contacted pharmacy regarding copay for Breo. [id number] for breo inhaler.   Will look into [employer] pateint assistance

## 2021-02-14 NOTE — Telephone Encounter (Signed)
PT returned phone call. She is interested in moving forward with [employer] paperwork. Will mail application to patients home & she will provide paystubs & oop expenses.

## 2021-02-15 ENCOUNTER — Other Ambulatory Visit: Payer: Self-pay

## 2021-02-15 DIAGNOSIS — E1165 Type 2 diabetes mellitus with hyperglycemia: Secondary | ICD-10-CM

## 2021-02-15 MED ORDER — INSULIN GLARGINE 100 UNIT/ML SOLOSTAR PEN: 14.0000 [IU] | PEN_INJECTOR | Freq: Every day | SUBCUTANEOUS | 1 refills | Status: DC

## 2021-02-21 MED ORDER — TRULICITY 3 MG/0.5ML ~~LOC~~ SOAJ: 3.0000 mg | SUBCUTANEOUS | 0 refills | Status: DC

## 2021-02-21 NOTE — Addendum Note (Signed)
Addended by: Cheral Almas on: 02/21/2021 02:11 PM   Modules accepted: Orders

## 2021-02-23 ENCOUNTER — Other Ambulatory Visit: Payer: Self-pay

## 2021-02-23 ENCOUNTER — Telehealth (INDEPENDENT_AMBULATORY_CARE_PROVIDER_SITE_OTHER): Payer: BC Managed Care – PPO | Admitting: Pharmacist

## 2021-02-23 DIAGNOSIS — I10 Essential (primary) hypertension: Secondary | ICD-10-CM | POA: Diagnosis not present

## 2021-02-23 DIAGNOSIS — E11319 Type 2 diabetes mellitus with unspecified diabetic retinopathy without macular edema: Secondary | ICD-10-CM | POA: Diagnosis not present

## 2021-02-23 MED ORDER — METFORMIN HCL ER 500 MG PO TB24: 500.0000 mg | ORAL_TABLET | Freq: Every day | ORAL | 0 refills | Status: DC

## 2021-02-23 MED ORDER — DAPAGLIFLOZIN PROPANEDIOL 5 MG PO TABS: 5.0000 mg | ORAL_TABLET | Freq: Every day | ORAL | 3 refills | Status: DC

## 2021-02-23 NOTE — Assessment & Plan Note (Signed)
Hypertension - Unable to obtain blood pressure as patient presented via telemedicine, but patient reports ability to check blood pressure at work. Will discontinue amlodipine to see if this improved daytime fatigue. Patient instructed to check blood pressure at work over next week and call with daily results to discuss additional therapy if necessary upon discontinuation of amlodipine. If daytime fatigue continues despite discontinuation of amlodipine will send to PCP for further work-up.

## 2021-02-23 NOTE — Progress Notes (Signed)
Subjective:    Patient ID: Carl [name], adult    DOB: 14-Aug-1985, 35 y.o.   MRN: [id number]  HPI Patient is a 35 y.o. male who presents for diabetes management. She is in good spirits and presents without assistance. Patient was referred on 12/13/20 and last seen by Primary Care Provider on 02/04/21. Last seen in pharmacy clinic on 12/31/20.  I connected with patient by a video enabled telemedicine application and verified that I am speaking with the correct person using two identifiers.   I discussed the limitations of evaluation and management by telemedicine. The patient expressed understanding and agreed to proceed. Patient located at work and provider located at office.  Patient reports continuing to tolerate Trulicity well and finished last dose of 1.17m pen last week with no side effects. Patient states her blood glucose readings have improved greatly which she is happy about, but since her last office with PCP she has had constant daytime fatigue. She is not sure if this is related to the change in medication (addition of amlodipine) but that she is constantly tired all day long and is struggling to stay awake. She reports sleeping well throughout the night so it is not attributed to lack of sleep.   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 Patient reports adherence with medications.  Do you feel that your medications are working for you?  no  Have you been experiencing any side effects to the medications prescribed? no  Do you have any problems obtaining medications due to transportation or finances?  no     Patient reported dietary habits:  Eats 3 meals/day and 1 snacks/day Breakfast: protein shake or fiber 1 bars Lunch: low carb frozen meals Dinner: low carb frozen meals Snacks: sometimes yes sometimes no (popcorn)  Drinks: water; diet soda  Patient-reported exercise habits: Cardio (bike) at least 30 minutes with goal of getting to 3x/week   Patient reports hypoglycemic events. Patient is not sure of cause but CGM data demonstrates this occurs later in evening/overnight.  Patient denies polyuria (increased urination).  Patient denies polyphagia (increased appetite).  Patient denies polydipsia (increased thirst).  Patient reports neuropathy (nerve pain); numbness in legs Patient denies visual changes. Patient reports self foot exams.   Freestyle Libre 2.0 patient education Patient taking >500 mg Vitamin C: denies Reminded patient to not take Vitamin C with Freestyle Libre.  Sensor application  1. Site selection and site prep with alcohol pad/Skin Tac 2. Sensor prep-sensor pack and sensor applicator 3. Starting the sensor: 1 hour warm up before BG readings available     Will ask for fingersticks the first 12 hours   4. Sensor change every 14 days and rotate site 5. Call [employer] customer service if sensor comes off before 14 days  Safety and Troubleshooting 1. Scan the sensor at least every 8 hours 2. When the "test BG" symbol appears, test fingerstick blood glucose prior to    making treatment decisions 3. Do a fingerstick blood glucose test if the sensor readings do not match how    you feel 4. Remove sensor prior for MRI or CT. Sensor may be damaged by exposure to    airport x-ray screening 5. Vitamin C may cause  false high readings and aspirin may cause false low     readings 6. Store sensor kit between 39 and 77 degrees. Can be refrigerated at this temp.  Contact information provided for [employer] customer service and/or trainer.  Objective:   CGM report from last 14  days    Labs:   Physical Exam Neurological:     Mental Status: She is alert and oriented to person, place, and time.    Lab Results  Component Value Date   HGBA1C 11.4 (A) 12/13/2020   HGBA1C 12.9 (A) 11/07/2018   HGBA1C 13.3 (A) 01/14/2018    Lipid Panel     Component Value Date/Time   CHOL 304 (H) 12/13/2020 0917   TRIG 577 ([place]) 12/13/2020 0917   HDL 29 (L) 12/13/2020 0917   CHOLHDL 10.5 (H) 12/13/2020 0917   LDLCALC 160 (H) 12/13/2020 0917   LDLDIRECT 133 (H) 02/04/2021 1004    Clinical Atherosclerotic Cardiovascular Disease (ASCVD): No  The ASCVD Risk score (Arnett DK, et al., 2019) failed to calculate for the following reasons:   The 2019 ASCVD risk score is only valid for ages 74 to 4    Assessment/Plan:   T2DM control is improving likely due to frequent adjustments in regimen and optimal adherence to medication. As patient's blood glucose readings have reduced will add on Farxiga, which patient had been tolerating in the past prior to establishing care with our clinic. Will also increase Trulicity to 61m once weekly on Thursdays. Patient previously did not tolerate metformin but is not sure if this was extended release version, and is willing to re-try metformin XR to see if able to obtain better tolerability. Patient educated on purpose, proper use and potential adverse effects of Farxiga, Trulicity, and metformin. Discussed with patient benefit of eating larger evening meal with increase in protein or eating a small snack before dinner to help minimize evening/nighttime hypoglycemic events. Patient takes insulin in AM and remains euglycemic/hyperglycemic throughout daytime therefore do not feel need to adjust insulin dose. Following instruction patient verbalized understanding of treatment plan.   Following instruction patient verbalized understanding of treatment plan.    Continued dose of basal insulin glargine (Lantus) 14 units daily Continued GLP-1 Trulicity 317m once weekly on Thursdays Started Farxiga 68m56mnce daily. Counseled on sick day rules Started metformin XR 500m22mce daily with breakfast Extensively discussed pathophysiology of diabetes, dietary effects on blood sugar control, and recommended lifestyle interventions. Patient will exercise 3x/week with goal to increase towards target of at least 150 minutes of moderate intensity exercise weekly Counseled on s/sx of and management of hypoglycemia Next A1C anticipated November 2022.   Follow-up appointment 2-3 weeks to review sugar readings. Written patient instructions provided.  Hypertension - Unable to obtain blood pressure as patient presented via telemedicine, but patient reports ability to check blood pressure at work. Will discontinue amlodipine to see if this improved daytime fatigue. Patient instructed to check blood pressure at work over next week and call with daily results to discuss additional therapy if necessary upon discontinuation of amlodipine. If daytime fatigue continues despite discontinuation of amlodipine will send to PCP for further work-up.  This appointment required 33 minutes of non-face-to-face direct patient care.  Thank you for involving pharmacy to assist in providing this patient's care.

## 2021-02-23 NOTE — Assessment & Plan Note (Signed)
T2DM control is improving likely due to frequent adjustments in regimen and optimal adherence to medication. As patient's blood glucose readings have reduced will add on Farxiga, which patient had been tolerating in the past prior to establishing care with our clinic. Will also increase Trulicity to 3mg  once weekly on Thursdays. Patient previously did not tolerate metformin but is not sure if this was extended release version, and is willing to re-try metformin XR to see if able to obtain better tolerability. Patient educated on purpose, proper use and potential adverse effects of Farxiga, Trulicity, and metformin. Discussed with patient benefit of eating larger evening meal with increase in protein or eating a small snack before dinner to help minimize evening/nighttime hypoglycemic events. Patient takes insulin in AM and remains euglycemic/hyperglycemic throughout daytime therefore do not feel need to adjust insulin dose. Following instruction patient verbalized understanding of treatment plan.   Following instruction patient verbalized understanding of treatment plan.    1. Continued dose of basal insulin glargine (Lantus) 14 units daily 2. Continued GLP-1 Trulicity 3mg  once weekly on Thursdays 3. Started Farxiga 5mg  once daily. Counseled on sick day rules 4. Started metformin XR 500mg  once daily with breakfast 5. Extensively discussed pathophysiology of diabetes, dietary effects on blood sugar control, and recommended lifestyle interventions. 6. Patient will exercise 3x/week with goal to increase towards target of at least 150 minutes of moderate intensity exercise weekly 7. Counseled on s/sx of and management of hypoglycemia 8. Next A1C anticipated November 2022.

## 2021-03-11 ENCOUNTER — Ambulatory Visit: Payer: BC Managed Care – PPO | Admitting: Family Medicine

## 2021-03-16 ENCOUNTER — Telehealth (INDEPENDENT_AMBULATORY_CARE_PROVIDER_SITE_OTHER): Payer: BC Managed Care – PPO | Admitting: Pharmacist

## 2021-03-16 ENCOUNTER — Other Ambulatory Visit: Payer: Self-pay

## 2021-03-16 DIAGNOSIS — I10 Essential (primary) hypertension: Secondary | ICD-10-CM

## 2021-03-16 DIAGNOSIS — E11319 Type 2 diabetes mellitus with unspecified diabetic retinopathy without macular edema: Secondary | ICD-10-CM | POA: Diagnosis not present

## 2021-03-16 MED ORDER — DAPAGLIFLOZIN PROPANEDIOL 10 MG PO TABS: 10.0000 mg | ORAL_TABLET | Freq: Every day | ORAL | 2 refills | Status: DC

## 2021-03-16 MED ORDER — METFORMIN HCL ER 750 MG PO TB24: 750.0000 mg | ORAL_TABLET | Freq: Every day | ORAL | 0 refills | Status: DC

## 2021-03-16 MED ORDER — FREESTYLE LIBRE 3 SENSOR MISC: 1.0000 "application " | 2 refills | Status: DC

## 2021-03-16 NOTE — Assessment & Plan Note (Signed)
T2DM control is improving likely due to frequent adjustments in regimen and optimal adherence to medication. Patient has follow-up appt with PCP after completing fourth dose of Trulicity and at that time PCP can increase to 4.5mg  assuming patient continues to tolerate last two doses. Patient will run out of [place] before PCP appt and it is appropriate to increase after patient completes one month supply therefore instructed patient to finish current bottle and then pick up new bottle of 10mg  when she runs out. Due to patient tolerating metformin relatively well will attempt to increase dose from 500mg  to 750mg .  Following instruction patient verbalized understanding of treatment plan.   1. Continued dose of basal insulin glargine (Lantus) 14 units daily 2. Continued GLP-1 Trulicity 3mg  once weekly on Thursdays 3. Continued Farxiga 5mg  once daily and once finished start 10mg  once daily 4. Increased dose of metformin XR to 750mg  once daily with breakfast 5. Extensively discussed pathophysiology of diabetes, dietary effects on blood sugar control, and recommended lifestyle interventions. 6. Patient will work towards target of at least 150 minutes of moderate intensity exercise weekly 7. Counseled on s/sx of and management of hypoglycemia 8. Next A1C anticipated at next PCP appt

## 2021-03-16 NOTE — Assessment & Plan Note (Signed)
Hypertension - Instructed patient to restart amlodipine 5mg . Will have BP checked at PCP appt in two weeks

## 2021-03-16 NOTE — Progress Notes (Addendum)
Subjective:    Patient ID: Carl [name], adult    DOB: 10/17/1985, 35 y.o.   MRN: [id number]  HPI Patient is a 35 y.o. male who presents for diabetes management. She is in good spirits and presents without assistance. Patient was referred on 12/13/20 and last seen by Primary Care Provider on 02/04/21. Last seen in pharmacy clinic on 12/31/20.  I connected with patient by a video enabled telemedicine application and verified that I am speaking with the correct person using two identifiers.   I discussed the limitations of evaluation and management by telemedicine. The patient expressed understanding and agreed to proceed. Patient located at work and provider located at office.  Patient reports since discontinuing amlodipine she did not notice an improvement in fatigue, however, she did notice a slight increase in her blood pressure readings of 119-127/80-90's. Patient has not restarted amlodipine because she wanted to discuss at this appointment. She reports she thinks she knows what may be the cause of the fatigue but did not elaborate further. She reports a recent death in the family which she states lead to periods of time without consuming food causing hypoglycemic events. Otherwise reports blood glucose was normal. She continues to tolerate increases in Trulicity well.   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 Patient reports adherence with medications.  Do you feel that your medications are working for you?  no  Have you been experiencing any side effects to the  medications prescribed? Yes; reports metformin causes bloat and some flatulence but states it is tolerable  Do you have any problems obtaining medications due to transportation or finances?  no    Patient reported dietary habits:  Eats 3 meals/day and 1 snacks/day Breakfast: protein shake or fiber 1 bars Lunch: low carb frozen meals Dinner: low carb frozen meals Snacks: sometimes yes sometimes no (popcorn)  Drinks: water; diet soda  Patient-reported exercise habits: Cardio (bike) at least 30 minutes with goal of getting to 3x/week but has difficulty with leg pains   Patient reports hypoglycemic events. Patient denies polyuria (increased urination).  Patient denies polyphagia (increased appetite).  Patient denies polydipsia (increased thirst).  Patient reports neuropathy (nerve pain); numbness in legs Patient denies visual changes. Patient reports self foot exams.   Freestyle Libre 2.0 patient education Patient taking >500 mg Vitamin C: denies Reminded patient to not take Vitamin C with Freestyle Libre.  Sensor application  1. Site selection and site prep with alcohol pad/Skin Tac 2. Sensor prep-sensor pack and sensor applicator 3. Starting the sensor: 1 hour warm up before BG readings available     Will ask for fingersticks the first 12 hours   4. Sensor change every 14 days and rotate site 5. Call [employer] customer service if sensor comes off before 14 days  Safety and Troubleshooting 1. Scan the sensor at least every 8 hours 2. When the "test BG" symbol appears, test fingerstick blood glucose prior to    making treatment decisions 3. Do a fingerstick blood glucose test if the sensor readings do not match how    you feel 4. Remove sensor prior for MRI or CT. Sensor may be damaged by exposure to  airport x-ray screening 5. Vitamin C may cause false high readings and aspirin may cause false low     readings 6. Store sensor kit between 39 and 77 degrees. Can be refrigerated  at this temp.  Contact information provided for [employer] customer service and/or trainer.  Objective:   CGM report from last 14 days    Labs:   Physical Exam Neurological:     Mental Status: She is alert and oriented to person, place, and time.    Lab Results  Component Value Date   HGBA1C 11.4 (A) 12/13/2020   HGBA1C 12.9 (A) 11/07/2018   HGBA1C 13.3 (A) 01/14/2018    Lipid Panel     Component Value Date/Time   CHOL 304 (H) 12/13/2020 0917   TRIG 577 ([place]) 12/13/2020 0917   HDL 29 (L) 12/13/2020 0917   CHOLHDL 10.5 (H) 12/13/2020 0917   LDLCALC 160 (H) 12/13/2020 0917   LDLDIRECT 133 (H) 02/04/2021 1004    Clinical Atherosclerotic Cardiovascular Disease (ASCVD): No  The ASCVD Risk score (Arnett DK, et al., 2019) failed to calculate for the following reasons:   The 2019 ASCVD risk score is only valid for ages 57 to 86    Assessment/Plan:   T2DM control is improving likely due to frequent adjustments in regimen and optimal adherence to medication. Patient has follow-up appt with PCP after completing fourth dose of Trulicity and at that time PCP can increase to 4.93m assuming patient continues to tolerate last two doses. Patient will run out of [place] PCP appt and it is appropriate to increase after patient completes one month supply therefore instructed patient to finish current bottle and then pick up new bottle of 167mwhen she runs out. Due to patient tolerating metformin relatively well will attempt to increase dose from 50012mo 750m60mFollowing instruction patient verbalized understanding of treatment plan.   Continued dose of basal insulin glargine (Lantus) 14 units daily Continued GLP-1 Trulicity 3mg 38me weekly on Thursdays Continued Farxiga 5mg o63m daily and once finished start Farxiga 10mg o12mdaily Increased dose of metformin XR to 750mg on25maily with breakfast Extensively discussed pathophysiology of diabetes, dietary effects on blood sugar  control, and recommended lifestyle interventions. Patient will work towards target of at least 150 minutes of moderate intensity exercise weekly Counseled on s/sx of and management of hypoglycemia Next A1C anticipated at next PCP appt  Patient interested in finding mental health therapist. Provided [email or web address] website to filter by zip code or look up in-network providers on insurance website  Follow-up appointment 8 weeks to review sugar readings. Written patient instructions provided.  Hypertension - Instructed patient to restart amlodipine 5mg. Wil66mave BP checked at PCP appt in two weeks  This appointment required 35 minutes of non-face-to-face direct patient care.  Thank you for involving pharmacy to assist in providing this patient's care.

## 2021-03-21 ENCOUNTER — Encounter: Payer: Self-pay | Admitting: Family Medicine

## 2021-03-21 ENCOUNTER — Telehealth: Payer: BC Managed Care – PPO | Admitting: Family Medicine

## 2021-03-21 DIAGNOSIS — J069 Acute upper respiratory infection, unspecified: Secondary | ICD-10-CM | POA: Diagnosis not present

## 2021-03-21 MED ORDER — BENZONATATE 100 MG PO CAPS: 100.0000 mg | ORAL_CAPSULE | Freq: Two times a day (BID) | ORAL | 0 refills | Status: DC | PRN

## 2021-03-21 MED ORDER — FLUTICASONE PROPIONATE 50 MCG/ACT NA SUSP: 2.0000 | Freq: Every day | NASAL | 0 refills | Status: DC

## 2021-03-21 NOTE — Progress Notes (Signed)
E-Visit for Sinus Problems  We are sorry that you are not feeling well.  Here is how we plan to help!  Based on what you have shared with me it looks like you have sinusitis.  Sinusitis is inflammation and infection in the sinus cavities of the head.  Based on your presentation I believe you most likely have Acute Viral Sinusitis.This is an infection most likely caused by a virus. There is not specific treatment for viral sinusitis other than to help you with the symptoms until the infection runs its course.  You may use an oral decongestant such as Mucinex D or if you have glaucoma or high blood pressure use plain Mucinex. Saline nasal spray help and can safely be used as often as needed for congestion, I have prescribed: Fluticasone nasal spray two sprays in each nostril once a day I have also ordered some cough medication. If your symptoms do not improved in 3-4 days at that time an antibiotic could be considered.  Some authorities believe that zinc sprays or the use of Echinacea may shorten the course of your symptoms.  Sinus infections are not as easily transmitted as other respiratory infection, however we still recommend that you avoid close contact with loved ones, especially the very young and elderly.  Remember to wash your hands thoroughly throughout the day as this is the number one way to prevent the spread of infection!  Home Care: Only take medications as instructed by your medical team. Do not take these medications with alcohol. A steam or ultrasonic humidifier can help congestion.  You can place a towel over your head and breathe in the steam from hot water coming from a faucet. Avoid close contacts especially the very young and the elderly. Cover your mouth when you cough or sneeze. Always remember to wash your hands.  Get Help Right Away If: You develop worsening fever or sinus pain. You develop a severe head ache or visual changes. Your symptoms persist after you have  completed your treatment plan.  Make sure you Understand these instructions. Will watch your condition. Will get help right away if you are not doing well or get worse.   Thank you for choosing an e-visit.  Your e-visit answers were reviewed by a board certified advanced clinical practitioner to complete your personal care plan. Depending upon the condition, your plan could have included both over the counter or prescription medications.  Please review your pharmacy choice. Make sure the pharmacy is open so you can pick up prescription now. If there is a problem, you may contact your provider through [employer] and have the prescription routed to another pharmacy.  Your safety is important to [place]. If you have drug allergies check your prescription carefully.   For the next 24 hours you can use MyChart to ask questions about today's visit, request a non-urgent call back, or ask for a work or school excuse. You will get an email in the next two days asking about your experience. I hope that your e-visit has been valuable and will speed your recovery.  I provided 5 minutes of non face-to-face time during this encounter for chart review, medication and order placement, as well as and documentation.

## 2021-03-22 ENCOUNTER — Ambulatory Visit: Payer: Self-pay | Admitting: Diagnostic Neuroimaging

## 2021-03-23 MED ORDER — TRULICITY 4.5 MG/0.5ML ~~LOC~~ SOAJ: 4.5000 mg | SUBCUTANEOUS | 2 refills | Status: DC

## 2021-03-23 NOTE — Progress Notes (Signed)
Patient sent message stating she actually completed last dose of Trulicity 3mg  last Thursday. Will send in prescription for 4.5mg  dose for patient to begin tomorrow.

## 2021-03-23 NOTE — Addendum Note (Signed)
Addended by: Cheral Almas on: 03/23/2021 10:35 AM   Modules accepted: Orders

## 2021-03-28 ENCOUNTER — Other Ambulatory Visit: Payer: Self-pay

## 2021-03-28 ENCOUNTER — Ambulatory Visit: Payer: BC Managed Care – PPO | Admitting: Family Medicine

## 2021-03-28 ENCOUNTER — Encounter: Payer: Self-pay | Admitting: Family Medicine

## 2021-03-28 VITALS — BP 140/96 | HR 100 | Wt 235.0 lb

## 2021-03-28 DIAGNOSIS — J019 Acute sinusitis, unspecified: Secondary | ICD-10-CM

## 2021-03-28 DIAGNOSIS — I10 Essential (primary) hypertension: Secondary | ICD-10-CM

## 2021-03-28 DIAGNOSIS — G2581 Restless legs syndrome: Secondary | ICD-10-CM

## 2021-03-28 DIAGNOSIS — E11319 Type 2 diabetes mellitus with unspecified diabetic retinopathy without macular edema: Secondary | ICD-10-CM

## 2021-03-28 DIAGNOSIS — E785 Hyperlipidemia, unspecified: Secondary | ICD-10-CM

## 2021-03-28 DIAGNOSIS — Z9114 Patient's other noncompliance with medication regimen: Secondary | ICD-10-CM

## 2021-03-28 DIAGNOSIS — Z113 Encounter for screening for infections with a predominantly sexual mode of transmission: Secondary | ICD-10-CM

## 2021-03-28 DIAGNOSIS — B9689 Other specified bacterial agents as the cause of diseases classified elsewhere: Secondary | ICD-10-CM

## 2021-03-28 MED ORDER — DOXYCYCLINE HYCLATE 100 MG PO TABS: 100.0000 mg | ORAL_TABLET | Freq: Two times a day (BID) | ORAL | 0 refills | Status: AC

## 2021-03-28 NOTE — Assessment & Plan Note (Signed)
Although not yet age 35, given family history, may benefit from statin.

## 2021-03-28 NOTE — Assessment & Plan Note (Signed)
Near goal, just restarted amlodipine. Will monitor, will consider addition of spironolactone vs. Going up at Losartan.

## 2021-03-28 NOTE — Progress Notes (Signed)
    SUBJECTIVE:   CHIEF COMPLAINT: congestion, A1C HPI:  Carl [name] is a 35 y.o. yo with history notable for hypertension, type 2 DM, and neuropathic pain presenting for check up.  HTN Carl [name] has restarted her amlodipine. Continues on losartan at this time. No vision changes, HA, or exertional CP.   Congestion She had 8 days of congestion, cough, intermittent fevers, and feeling fatigued. Last fever Friday. Initially she improved but worsened again over weekend. Has chest pain with coughing. Feels some intermittent wheezing which is not particularly helped by albuterol therapy. Denies dyspnea, chest pain, fevers, severe head.   Type 2 Diabetes Wearing CGM. She is compliant with therapy. Amenable to statin if indicated. She does report her neuropathy is worsening. Denies polyuria, polydipsia, hypoglycemia.   PREP Therapy: Tolerating [place].   Fatigue: reports worsening daytime fatigue. No snoring. Has moderate sleep quality. Mood is okay. Reports daily fatigue, worsening restless leg symptoms. No edema, dyspnea, chest pain. 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   Meds reviewed- only takes ibuprofen intermittently.   PERTINENT  PMH / PSH/Family/Social History : updated and reviewed, recently experienced loss in family   OBJECTIVE:   BP (!) 140/96   Pulse 100   Wt 235 lb (106.6 kg)   SpO2 99%   BMI 33.72 kg/m   Today's weight:  Last Weight  Most recent update: 03/28/2021  8:57 AM    Weight  106.6 kg (235 lb)            Review of prior weights: [employer]   03/28/21 0856  Weight: 235 lb (106.6 kg)   HEENT + Retracted R TM Nasal turbinates edematous No TTP along sinuses + postnasal drip  No LAD  Cardiac: Regular rate and rhythm. Normal S1/S2. No murmurs, rubs, or gallops appreciated. Lungs: Clear bilaterally to ascultation.  Psych: Pleasant and appropriate    ASSESSMENT/PLAN:   Essential hypertension Near goal, just  restarted amlodipine. Will monitor, will consider addition of spironolactone vs. Going up at Losartan.   PREP Therapy  Tolerating therapy. Due for labs in end of December.   Diabetes (HCC) A1C today. Triglycerides today. Will start a statin if persistently elevated.   Dyslipidemia Although not yet age 23, given family history, may benefit from statin.    Acute bacterial sinusitis, suspect superimposed on top of recent viral illness.  Do not suspect pneumonia given clear lungs.  No evidence of asthma exacerbation.  Prescription for doxycycline for 5 days given.  Reviewed strict return precautions.  To continue supportive therapies.  Restless leg syndrome suspect this is related to polyneuropathy in setting of diabetes.  Will obtain ferritin and CBC today. We will check TSH today.   HCM  UTD--COVID vaccine at next visit.      Terisa Starr, MD  Family Medicine Teaching Service  [facility]

## 2021-03-28 NOTE — Assessment & Plan Note (Signed)
Tolerating therapy. Due for labs in end of December.

## 2021-03-28 NOTE — Assessment & Plan Note (Signed)
A1C today. Triglycerides today. Will start a statin if persistently elevated.

## 2021-03-28 NOTE — Patient Instructions (Signed)
It was wonderful to see you today.  Please bring ALL of your medications with you to every visit.   Today we talked about:  -Keep a log of your blood pressure in the morning 3-4 times per week now that you started back on the Norvasc   - I will call you with your blood work  --We will check into your fatigue more  For the sinus/congestion/cough--albuterol twice per day, I sent in Doxycycline 100 mg twice per day to your pharmacy. Drink plenty of fluids--Tylenol for pain   Thank you for choosing [facility].   Please call [phone] with any questions about today's appointment.  Please be sure to schedule follow up at the front  desk before you leave today.   Terisa Starr, MD  Family Medicine

## 2021-03-29 ENCOUNTER — Encounter: Payer: Self-pay | Admitting: Family Medicine

## 2021-03-29 DIAGNOSIS — E785 Hyperlipidemia, unspecified: Secondary | ICD-10-CM

## 2021-03-29 LAB — TRIGLYCERIDES: Triglycerides: 204 mg/dL — ABNORMAL HIGH (ref 0–149)

## 2021-03-29 LAB — TSH: TSH: 1.75 u[IU]/mL (ref 0.450–4.500)

## 2021-03-29 LAB — CBC
Hematocrit: 45.9 % (ref 37.5–51.0)
Hemoglobin: 15.1 g/dL (ref 13.0–17.7)
MCH: 28.2 pg (ref 26.6–33.0)
MCHC: 32.9 g/dL (ref 31.5–35.7)
MCV: 86 fL (ref 79–97)
Platelets: 458 10*3/uL — ABNORMAL HIGH (ref 150–450)
RBC: 5.35 x10E6/uL (ref 4.14–5.80)
RDW: 13.2 % (ref 11.6–15.4)
WBC: 11.2 10*3/uL — ABNORMAL HIGH (ref 3.4–10.8)

## 2021-03-29 LAB — FERRITIN: Ferritin: 218 ng/mL (ref 30–400)

## 2021-03-29 LAB — HEMOGLOBIN A1C
Est. average glucose Bld gHb Est-mCnc: 137 mg/dL
Hgb A1c MFr Bld: 6.4 % — ABNORMAL HIGH (ref 4.8–5.6)

## 2021-03-29 MED ORDER — ATORVASTATIN CALCIUM 20 MG PO TABS: 20.0000 mg | ORAL_TABLET | Freq: Every day | ORAL | 3 refills | Status: DC

## 2021-04-18 ENCOUNTER — Telehealth: Payer: BC Managed Care – PPO | Admitting: Nurse Practitioner

## 2021-04-18 DIAGNOSIS — U071 COVID-19: Secondary | ICD-10-CM

## 2021-04-18 MED ORDER — MOLNUPIRAVIR EUA 200MG CAPSULE: 4.0000 | ORAL_CAPSULE | Freq: Two times a day (BID) | ORAL | 0 refills | Status: AC

## 2021-04-18 MED ORDER — METFORMIN HCL ER 750 MG PO TB24
750.0000 mg | ORAL_TABLET | Freq: Every day | ORAL | 2 refills | Status: DC
Start: 2021-04-18 — End: 2021-06-15

## 2021-04-18 NOTE — Addendum Note (Signed)
Addended by: Cheral Almas on: 04/18/2021 09:59 AM   Modules accepted: Orders

## 2021-04-18 NOTE — Patient Instructions (Signed)
You are being prescribed MOLNUPIRAVIR for COVID-19 infection.  ° ° °Please call the pharmacy or go through the drive through vs going inside if you are picking up the mediation yourself to prevent further spread. If prescribed to a [facility] affiliated pharmacy, a pharmacist will bring the medication out to your car. ° ° °ADMINISTRATION INSTRUCTIONS: °Take with or without food. Swallow the tablets whole. Don't chew, crush, or break the medications because it might not work as well ° °For each dose of the medication, you should be taking FOUR tablets at one time, TWICE a day  ° °Finish your full five-day course of Molnupiravir even if you feel better before you're done. Stopping this medication too early can make it less effective to prevent severe illness related to COVID19.   ° °Molnupiravir is prescribed for YOU ONLY. Don't share it with others, even if they have similar symptoms as you. This medication might not be right for everyone.  ° °Make sure to take steps to protect yourself and others while you're taking this medication in order to get well soon and to prevent others from getting sick with COVID-19. ° ° °**If you are of childbearing potential (any gender) - it is advised to not get pregnant while taking this medication and recommended that condoms are used for male partners the next 3 months after taking the medication out of extreme caution  ° ° °COMMON SIDE EFFECTS: °Diarrhea °Nausea  °Dizziness ° ° ° °If your COVID-19 symptoms get worse, get medical help right away. Call 911 if you experience symptoms such as worsening cough, trouble breathing, chest pain that doesn't go away, confusion, a hard time staying awake, and pale or blue-colored skin. °This medication won't prevent all COVID-19 cases from getting worse.  ° ° °

## 2021-04-18 NOTE — Progress Notes (Signed)
Virtual Visit Consent   [name], you are scheduled for a virtual visit with a [facility] provider today.     Just as with appointments in the office, your consent must be obtained to participate.  Your consent will be active for this visit and any virtual visit you may have with one of our providers in the next 365 days.     If you have a MyChart account, a copy of this consent can be sent to you electronically.  All virtual visits are billed to your insurance company just like a traditional visit in the office.    As this is a virtual visit, video technology does not allow for your provider to perform a traditional examination.  This may limit your provider's ability to fully assess your condition.  If your provider identifies any concerns that need to be evaluated in person or the need to arrange testing (such as labs, EKG, etc.), we will make arrangements to do so.     Although advances in technology are sophisticated, we cannot ensure that it will always work on either your end or our end.  If the connection with a video visit is poor, the visit may have to be switched to a telephone visit.  With either a video or telephone visit, we are not always able to ensure that we have a secure connection.     I need to obtain your verbal consent now.   Are you willing to proceed with your visit today?    Carl [name] has provided verbal consent on 04/18/2021 for a virtual visit (video or telephone).   [name], FNP   Date: 04/18/2021 9:18 AM   Virtual Visit via Video Note   I, [name], connected with  Carl [name]  ([id number], February 24, 1986) on 04/18/21 at  9:15 AM EST by a video-enabled telemedicine application and verified that I am speaking with the correct person using two identifiers.  Location: Patient: Virtual Visit Location Patient: Home Provider: Virtual Visit Location Provider: Home Office   I discussed the limitations of evaluation and management by  telemedicine and the availability of in person appointments. The patient expressed understanding and agreed to proceed.    History of Present Illness: Carl [name] is a 35 y.o. who identifies as a male who was assigned adult at birth, and is being seen today with complaints of cough that onset yesterday, when they woke up today they were experiencing body aches, cough, nasal congestion.   Has a history of asthma, has prescription for Albuterol inhaler uses for allergies- has been using on average once daily recently for more allergy symptoms.   Has had a flu shot this year.   Has been vaccinated for COVID last booster was in June No history of COVID infection in the past.   Took home COVID test during video visit. Tested positive.  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   Dyslipidemia 02/15/2018   Diabetes ([place]) 01/14/2018   Essential hypertension 01/14/2018   Prefers Male Pronouns--- [name]  01/14/2018    Allergies:  Allergies  Allergen Reactions   Lisinopril Cough   Metformin And Related Diarrhea    Has tried multiple times without  success    Medications:  Current Outpatient Medications:    Accu-Chek Softclix Lancets lancets, Use as instructed, Disp: 100 each, Rfl: 12   albuterol (VENTOLIN HFA) 108 (90 Base) MCG/ACT inhaler, Inhale 2 puffs into the lungs every 6 (six) hours as needed for wheezing or shortness of breath., Disp: 18 g, Rfl: 3   amLODipine (NORVASC) 5 MG tablet, Take 1 tablet (5 mg total) by mouth at bedtime., Disp: 90 tablet, Rfl: 3   atorvastatin (LIPITOR) 20 MG tablet, Take 1 tablet (20 mg total) by mouth at bedtime., Disp: 90 tablet, Rfl: 3   benzonatate (TESSALON) 100 MG capsule, Take  1 capsule (100 mg total) by mouth 2 (two) times daily as needed for cough. (Patient not taking: Reported on 03/28/2021), Disp: 20 capsule, Rfl: 0   Blood Glucose Monitoring Suppl (ACCU-CHEK AVIVA PLUS) w/Device KIT, Use to check blood glucose once daily before injecting insulin, Disp: 1 kit, Rfl: 0   cetirizine (ZYRTEC) 10 MG tablet, Take 10 mg by mouth daily., Disp: , Rfl:    Continuous Blood Gluc Sensor (FREESTYLE LIBRE 3 SENSOR) MISC, 1 application by Does not apply route as directed. Apply one sensor every 14 days, Disp: 2 each, Rfl: 2   dapagliflozin propanediol (FARXIGA) 10 MG TABS tablet, Take 1 tablet (10 mg total) by mouth daily before breakfast., Disp: 30 tablet, Rfl: 2   Dulaglutide (TRULICITY) 4.5 MO/2.9UT SOPN, Inject 4.5 mg into the skin once a week., Disp: 2 mL, Rfl: 2   emtricitabine-tenofovir (TRUVADA) 200-300 MG tablet, Take 1 tablet by mouth daily., Disp: 90 tablet, Rfl: 0   esomeprazole (NEXIUM) 20 MG capsule, Take 20 mg by mouth daily at 12 noon., Disp: , Rfl:    fluticasone (FLONASE) 50 MCG/ACT nasal spray, Place 2 sprays into both nostrils daily., Disp: 16 g, Rfl: 0   fluticasone furoate-vilanterol (BREO ELLIPTA) 100-25 MCG/INH AEPB, Inhale 1 puff into the lungs daily., Disp: 60 each, Rfl: 2   glucose blood (ACCU-CHEK ACTIVE STRIPS) test strip, Use as instructed, Disp: 100 each, Rfl: 12   glucose blood test strip, Use as instructed, Disp: 100 each, Rfl: 1   ibuprofen (ADVIL) 800 MG tablet, Take 200 mg by mouth every 8 (eight) hours as needed., Disp: , Rfl:    insulin glargine (LANTUS) 100 UNIT/ML Solostar Pen, Inject 14 Units into the skin daily., Disp: 15 mL, Rfl: 1   Insulin Pen Needle (NOVOFINE) 30G X 8 MM MISC, Use to inject insulin daily, Disp: 100 each, Rfl: 11   losartan (COZAAR) 50 MG tablet, Take 1 tablet (50 mg total) by mouth at bedtime., Disp: 90 tablet, Rfl: 3   metFORMIN (GLUCOPHAGE XR) 750 MG 24 hr tablet, Take 1 tablet (750 mg total) by mouth daily with  breakfast., Disp: 30 tablet, Rfl: 0   Multiple Vitamins-Minerals (MULTIVITAMIN WITH MINERALS) tablet, Take 1 tablet by mouth daily., Disp: , Rfl:    ondansetron (ZOFRAN ODT) 4 MG disintegrating tablet, Take 1 tablet (4 mg total) by mouth every 8 (eight) hours as needed for nausea or vomiting., Disp: 20 tablet, Rfl: 0   SUMAtriptan (IMITREX) 25 MG tablet, Take 1 tablet (25 mg total) by mouth once for 1 dose. May repeat in 2 hours if headache persists or recurs., Disp: 10 tablet, Rfl: 0  Observations/Objective: Patient is well-developed, well-nourished in no acute distress.  Resting comfortably at home.  Head is normocephalic, atraumatic.  No labored breathing.  Speech is clear and coherent with logical content.  Patient is alert  and oriented at baseline.    Assessment and Plan: 1. COVID-19  - molnupiravir EUA (LAGEVRIO) 200 mg CAPS capsule; Take 4 capsules (800 mg total) by mouth 2 (two) times daily for 5 days.  Dispense: 40 capsule; Refill: 0   Continue to use your inhaler as directed  Mucinex OTC as directed for cough support.    Follow Up Instructions: I discussed the assessment and treatment plan with the patient. The patient was provided an opportunity to ask questions and all were answered. The patient agreed with the plan and demonstrated an understanding of the instructions.  A copy of instructions were sent to the patient via MyChart unless otherwise noted below.     The patient was advised to call back or seek an in-person evaluation if the symptoms worsen or if the condition fails to improve as anticipated.  Time:  I spent 15 minutes with the patient via telehealth technology discussing the above problems/concerns.    [name], FNP

## 2021-04-19 ENCOUNTER — Encounter (INDEPENDENT_AMBULATORY_CARE_PROVIDER_SITE_OTHER): Payer: BC Managed Care – PPO | Admitting: Ophthalmology

## 2021-04-27 ENCOUNTER — Other Ambulatory Visit: Payer: Self-pay

## 2021-04-27 ENCOUNTER — Telehealth (INDEPENDENT_AMBULATORY_CARE_PROVIDER_SITE_OTHER): Payer: BC Managed Care – PPO | Admitting: Pharmacist

## 2021-04-27 DIAGNOSIS — E11319 Type 2 diabetes mellitus with unspecified diabetic retinopathy without macular edema: Secondary | ICD-10-CM | POA: Diagnosis not present

## 2021-04-27 DIAGNOSIS — Z794 Long term (current) use of insulin: Secondary | ICD-10-CM | POA: Diagnosis not present

## 2021-04-27 MED ORDER — MOUNJARO 10 MG/0.5ML ~~LOC~~ SOAJ: 10.0000 mg | SUBCUTANEOUS | 0 refills | Status: DC

## 2021-04-27 NOTE — Progress Notes (Signed)
Subjective:    Patient ID: Carl [name], adult    DOB: 1986/04/01, 35 y.o.   MRN: [id number]  HPI Patient is a 35 y.o. male who presents for diabetes management. She is in good spirits and presents without assistance. Patient was referred on 12/13/20 and last seen by Primary Care Provider on 03/28/21 Last seen in pharmacy clinic on 03/16/21.  I connected with patient by a video enabled telemedicine application and verified that I am speaking with the correct person using two identifiers.   I discussed the limitations of evaluation and management by telemedicine. The patient expressed understanding and agreed to proceed. Patient located at work and provider located at office.  Patient reports that she is just getting over COVID-19 so her blood glucose was a little abnormal. She reports a few low glucose readings which she attributes to this. No other recent changes and patient reports tolerating Trulicity 9.5JO once weekly well with no adverse events.  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 Patient reports adherence with medications.  Do you feel that your medications are working for you?  no  Have you been experiencing any side effects to the medications prescribed? Yes; reports metformin causes bloat and some flatulence but states it is tolerable  Do you have any problems obtaining medications due to transportation or finances?  no    Patient reported dietary habits:  Eats 3 meals/day and 1 snacks/day Breakfast: protein shake or fiber 1 bars Lunch: low carb frozen meals Dinner:  low carb frozen meals Snacks: sometimes yes sometimes no (popcorn)  Drinks: water; diet soda  Patient-reported exercise habits: Cardio (bike) at least 30 minutes with goal of getting to 3x/week but has difficulty with leg pains   Patient reports hypoglycemic events. Patient denies polyuria (increased urination).  Patient denies polyphagia (increased appetite).  Patient denies polydipsia (increased thirst).  Patient reports neuropathy (nerve pain); numbness in legs Patient denies visual changes. Patient reports self foot exams.   Freestyle Libre 3.0 patient education Patient taking >500 mg Vitamin C: denies Reminded patient to not take Vitamin C with Freestyle Libre.  Sensor application  1. Site selection and site prep with alcohol pad/Skin Tac 2. Sensor prep-sensor pack and sensor applicator 3. Starting the sensor: 1 hour warm up before BG readings available     Will ask for fingersticks the first 12 hours   4. Sensor change every 14 days and rotate site 5. Call [employer] customer service if sensor comes off before 14 days  Safety and Troubleshooting 1. Scan the sensor at least every 8 hours 2. When the "test BG" symbol appears, test fingerstick blood glucose prior to    making treatment decisions 3. Do a fingerstick blood glucose test if the sensor readings do not match how    you feel 4. Remove sensor prior for MRI or CT. Sensor may be damaged by exposure to    airport x-ray screening 5. Vitamin C may cause false high readings and aspirin may cause false low     readings 6. Store sensor kit between 39 and 77 degrees. Can be refrigerated at this temp.  Contact information provided for [employer] service and/or trainer.  Objective:  CGM report from last 14 days    Labs:   Physical Exam Neurological:     Mental Status: She is alert and oriented to person, place, and time.    Lab Results  Component Value Date   HGBA1C 6.4 (H) 03/28/2021   HGBA1C 11.4 (A)  12/13/2020   HGBA1C 12.9 (A) 11/07/2018    Lipid Panel     Component Value Date/Time   CHOL 304 (H) 12/13/2020 0917   TRIG 204 (H) 03/28/2021 0928   HDL 29 (L) 12/13/2020 0917   CHOLHDL 10.5 (H) 12/13/2020 0917   LDLCALC 160 (H) 12/13/2020 0917   LDLDIRECT 133 (H) 02/04/2021 1004    Clinical Atherosclerotic Cardiovascular Disease (ASCVD): No  The ASCVD Risk score (Arnett DK, et al., 2019) failed to calculate for the following reasons:   The 2019 ASCVD risk score is only valid for ages 86 to 34    Assessment/Plan:   T2DM controlled likely due to frequent adjustments in regimen and optimal adherence to medication. Patient tolerating current regimen well. Would like to work towards decreasing insulin as as much as possible. Patient agreeable to try switching to Mounjaro 19m once weekly and decreasing Lantus from 14 to 12 units once daily. Will continue close follow-up and make adjustments as needed to Lantus and max dose Mounjaro 1109m Following instruction patient verbalized understanding of treatment plan.   Decreased dose of basal insulin glargine (Lantus) to 12 units daily Discontinued GLP-1 Trulicity 4.1.2INnce weekly and started Mounjaro 1070mnce weekly on Thursdays Continued Farxiga 81m4mce daily Continued metformin XR to 750mg43me daily with breakfast Extensively discussed pathophysiology of diabetes, dietary effects on blood sugar control, and recommended lifestyle interventions. Patient will work towards target of at least 150 minutes of moderate intensity exercise weekly Counseled on s/sx of and management of hypoglycemia Next A1C anticipated February 2023  Follow-up appointment 5 weeks to review sugar readings. Written patient instructions provided.  This appointment required 24 minutes of non-face-to-face direct patient care.  Thank you for involving pharmacy to assist in providing this patient's care.

## 2021-04-27 NOTE — Assessment & Plan Note (Signed)
T2DM controlled likely due to frequent adjustments in regimen and optimal adherence to medication. Patient tolerating current regimen well. Would like to work towards decreasing insulin as as much as possible. Patient agreeable to try switching to [facility] 10mg  once weekly and decreasing Lantus from 14 to 12 units once daily. Will continue close follow-up and make adjustments as needed to Lantus and max dose Mounjaro 15mg . Following instruction patient verbalized understanding of treatment plan.   1. Decreased dose of basal insulin glargine (Lantus) to 12 units daily 2. Discontinued GLP-1 Trulicity 4.5mg  once weekly and started Mounjaro 10mg  once weekly on Thursdays 3. Continued Farxiga 10mg  once daily 4. Continued metformin XR to 750mg  once daily with breakfast 5. Extensively discussed pathophysiology of diabetes, dietary effects on blood sugar control, and recommended lifestyle interventions. 6. Patient will work towards target of at least 150 minutes of moderate intensity exercise weekly 7. Counseled on s/sx of and management of hypoglycemia 8. Next A1C anticipated February 2023  Follow-up appointment 5 weeks to review sugar readings.

## 2021-05-06 ENCOUNTER — Ambulatory Visit: Payer: BC Managed Care – PPO

## 2021-05-10 ENCOUNTER — Ambulatory Visit: Payer: BC Managed Care – PPO | Admitting: Student

## 2021-05-24 ENCOUNTER — Ambulatory Visit: Payer: BC Managed Care – PPO | Admitting: Diagnostic Neuroimaging

## 2021-05-24 ENCOUNTER — Encounter: Payer: Self-pay | Admitting: Diagnostic Neuroimaging

## 2021-05-24 VITALS — BP 135/89 | HR 99 | Ht 70.0 in | Wt 222.4 lb

## 2021-05-24 DIAGNOSIS — E1142 Type 2 diabetes mellitus with diabetic polyneuropathy: Secondary | ICD-10-CM | POA: Diagnosis not present

## 2021-05-24 DIAGNOSIS — M21371 Foot drop, right foot: Secondary | ICD-10-CM | POA: Diagnosis not present

## 2021-05-24 DIAGNOSIS — M21372 Foot drop, left foot: Secondary | ICD-10-CM | POA: Diagnosis not present

## 2021-05-24 NOTE — Progress Notes (Signed)
[facility]  PATIENT: Carl [name] DOB: 1986/02/25  REFERRING CLINICIAN: Martyn Malay, MD HISTORY FROM: patient  REASON FOR VISIT: new consult    HISTORICAL  CHIEF COMPLAINT:  Chief Complaint  Patient presents with   Polyneuropathy, foot drop    Rm 7 New Pt  Int ref "left leg calf muscle has atrophied, L foot drop, weakness in L calf, no dorsiflexion"     HISTORY OF PRESENT ILLNESS:   36 year old here for evaluation of lower extremity numbness, weakness and foot drop.  Symptoms started around the end of 2021 and have progressively worsened.  Patient also has lower back pain, worse with standing.  Has history of diabetes with A1c is greater than 11 in 2020 and 2021.  More recently diabetes control has improved with A1c less than 7.   REVIEW OF SYSTEMS: Full 14 system review of systems performed and negative with exception of: as per HPI.  ALLERGIES: Allergies  Allergen Reactions   Lisinopril Cough   Metformin And Related Diarrhea    Has tried multiple times without success     HOME MEDICATIONS: Outpatient Medications Prior to Visit  Medication Sig Dispense Refill   Accu-Chek Softclix Lancets lancets Use as instructed 100 each 12   albuterol (VENTOLIN HFA) 108 (90 Base) MCG/ACT inhaler Inhale 2 puffs into the lungs every 6 (six) hours as needed for wheezing or shortness of breath. 18 g 3   amLODipine (NORVASC) 5 MG tablet Take 1 tablet (5 mg total) by mouth at bedtime. 90 tablet 3   atorvastatin (LIPITOR) 20 MG tablet Take 1 tablet (20 mg total) by mouth at bedtime. 90 tablet 3   benzonatate (TESSALON) 100 MG capsule Take 1 capsule (100 mg total) by mouth 2 (two) times daily as needed for cough. 20 capsule 0   Blood Glucose Monitoring Suppl (ACCU-CHEK AVIVA PLUS) w/Device KIT Use to check blood glucose once daily before injecting insulin 1 kit 0   cetirizine (ZYRTEC) 10 MG tablet Take 10 mg by mouth daily.     Continuous Blood Gluc Sensor  (FREESTYLE LIBRE 3 SENSOR) MISC 1 application by Does not apply route as directed. Apply one sensor every 14 days 2 each 2   dapagliflozin propanediol (FARXIGA) 10 MG TABS tablet Take 1 tablet (10 mg total) by mouth daily before breakfast. 30 tablet 2   emtricitabine-tenofovir (TRUVADA) 200-300 MG tablet Take 1 tablet by mouth daily. 90 tablet 0   esomeprazole (NEXIUM) 20 MG capsule Take 20 mg by mouth daily at 12 noon.     fluticasone (FLONASE) 50 MCG/ACT nasal spray Place 2 sprays into both nostrils daily. 16 g 0   fluticasone furoate-vilanterol (BREO ELLIPTA) 100-25 MCG/INH AEPB Inhale 1 puff into the lungs daily. 60 each 2   glucose blood (ACCU-CHEK ACTIVE STRIPS) test strip Use as instructed 100 each 12   glucose blood test strip Use as instructed 100 each 1   ibuprofen (ADVIL) 800 MG tablet Take 200 mg by mouth every 8 (eight) hours as needed.     insulin glargine (LANTUS) 100 UNIT/ML Solostar Pen Inject 14 Units into the skin daily. 15 mL 1   Insulin Pen Needle (NOVOFINE) 30G X 8 MM MISC Use to inject insulin daily 100 each 11   losartan (COZAAR) 50 MG tablet Take 1 tablet (50 mg total) by mouth at bedtime. 90 tablet 3   metFORMIN (GLUCOPHAGE XR) 750 MG 24 hr tablet Take 1 tablet (750 mg total) by mouth daily with  breakfast. 30 tablet 2   Multiple Vitamins-Minerals (MULTIVITAMIN WITH MINERALS) tablet Take 1 tablet by mouth daily.     ondansetron (ZOFRAN ODT) 4 MG disintegrating tablet Take 1 tablet (4 mg total) by mouth every 8 (eight) hours as needed for nausea or vomiting. 20 tablet 0   tirzepatide (MOUNJARO) 10 MG/0.5ML Pen Inject 10 mg into the skin once a week. 2 mL 0   SUMAtriptan (IMITREX) 25 MG tablet Take 1 tablet (25 mg total) by mouth once for 1 dose. May repeat in 2 hours if headache persists or recurs. 10 tablet 0   No facility-administered medications prior to visit.    PAST MEDICAL HISTORY: Past Medical History:  Diagnosis Date   Diabetes mellitus without complication  ([place])    Dyslipidemia    Hypertension    Hypertension    Neuropathy     PAST SURGICAL HISTORY: Past Surgical History:  Procedure Laterality Date   MOUTH SURGERY     WISDOM TOOTH EXTRACTION      FAMILY HISTORY: Family History  Problem Relation Age of Onset   Diabetes Mother        died of complicatiosn of renal disease, DM    Diabetes Father    Diabetes Paternal Grandmother    Diabetes Paternal Grandfather     SOCIAL HISTORY: Social History   Socioeconomic History   Marital status: Single    Spouse name: Not on file   Number of children: 0   Years of education: Not on file   Highest education level: Bachelor's degree (e.g., BA, AB, BS)  Occupational History   Not on file  Tobacco Use   Smoking status: Never   Smokeless tobacco: Never  Substance and Sexual Activity   Alcohol use: Yes    Comment: rarely   Drug use: No   Sexual activity: Yes    Partners: Male    Birth control/protection: None  Other Topics Concern   Not on file  Social History Narrative   Works for [employer] in [place]    Has a dog- part [place]       The patient prefers the name [name].  She reports this preference is actually from the Bible as this is Andree Elk first wife in addition she does identify somewhat with the weekend culture and really likes this name.  She has been Arts development officer since college. Identifies as [place].    Social Determinants of Health   Financial Resource Strain: Not on file  Food Insecurity: Not on file  Transportation Needs: Not on file  Physical Activity: Not on file  Stress: Not on file  Social Connections: Not on file  Intimate Partner Violence: Not on file     PHYSICAL EXAM  GENERAL EXAM/CONSTITUTIONAL: Vitals:  Vitals:   05/24/21 1436  BP: 135/89  Pulse: 99  Weight: 222 lb 6.4 oz (100.9 kg)  Height: '5\' 10"'  (1.778 m)   Body mass index is 31.91 kg/m. Wt Readings from Last 3 Encounters:  05/24/21 222 lb 6.4 oz (100.9 kg)  03/28/21 235  lb (106.6 kg)  02/04/21 235 lb 3.2 oz (106.7 kg)   Patient is in no distress; well developed, nourished and groomed; neck is supple  CARDIOVASCULAR: Examination of carotid arteries is normal; no carotid bruits Regular rate and rhythm, no murmurs Examination of peripheral vascular system by observation and palpation is normal  EYES: Ophthalmoscopic exam of optic discs and posterior segments is normal; no papilledema or hemorrhages No results found.  MUSCULOSKELETAL: Gait, strength, tone, movements noted in Neurologic exam below  NEUROLOGIC: MENTAL STATUS:  No flowsheet data found. awake, alert, oriented to person, place and time recent and remote memory intact normal attention and concentration language fluent, comprehension intact, naming intact fund of knowledge appropriate  CRANIAL NERVE:  2nd - no papilledema on fundoscopic exam 2nd, 3rd, 4th, 6th - pupils equal and reactive to light, visual fields full to confrontation, extraocular muscles intact, no nystagmus 5th - facial sensation symmetric 7th - facial strength symmetric 8th - hearing intact 9th - palate elevates symmetrically, uvula midline 11th - shoulder shrug symmetric 12th - tongue protrusion midline  MOTOR:  normal bulk and tone, full strength in the BUE, BLE; EXCEPT RIGHT DF 4; LEFT DF / INV / EVER 3/5  SENSORY:  normal and symmetric to light touch, pinprick, temperature, vibration; EXCEPT DECR IN BILATERAL FEET; LEFT ALSO UP TO LATERAL CALF  COORDINATION:  finger-nose-finger, fine finger movements normal  REFLEXES:  deep tendon reflexes TRACE and symmetric  GAIT/STATION:  narrow based gait; CANNOT STAND ON HEELS ON BOTH SIDES     DIAGNOSTIC DATA (LABS, IMAGING, TESTING) - I reviewed patient records, labs, notes, testing and imaging myself where available.  Lab Results  Component Value Date   WBC 11.2 (H) 03/28/2021   HGB 15.1 03/28/2021   HCT 45.9 03/28/2021   MCV 86 03/28/2021   PLT 458  (H) 03/28/2021      Component Value Date/Time   NA 142 02/04/2021 1004   NA 135 (L) 02/03/2013 0804   K 4.8 02/04/2021 1004   K 3.9 02/03/2013 0804   CL 105 02/04/2021 1004   CL 101 02/03/2013 0804   CO2 23 02/04/2021 1004   CO2 25 02/03/2013 0804   GLUCOSE 161 (H) 02/04/2021 1004   GLUCOSE 400 (H) 07/05/2018 1712   GLUCOSE 351 (H) 02/03/2013 0804   BUN 12 02/04/2021 1004   BUN 10 02/03/2013 0804   CREATININE 0.77 02/04/2021 1004   CREATININE 0.65 02/03/2013 0804   CALCIUM 9.8 02/04/2021 1004   CALCIUM 9.0 02/03/2013 0804   PROT 7.1 12/13/2020 0917   PROT 6.8 02/03/2013 0804   ALBUMIN 4.6 12/13/2020 0917   ALBUMIN 3.7 02/03/2013 0804   AST 23 12/13/2020 0917   AST 36 02/03/2013 0804   ALT 34 12/13/2020 0917   ALT 67 02/03/2013 0804   ALKPHOS 125 (H) 12/13/2020 0917   ALKPHOS 121 02/03/2013 0804   BILITOT 0.3 12/13/2020 0917   BILITOT 0.6 02/03/2013 0804   GFRNONAA 118 11/07/2018 1054   GFRNONAA >60 02/03/2013 0804   GFRAA 137 11/07/2018 1054   GFRAA >60 02/03/2013 0804   Lab Results  Component Value Date   CHOL 304 (H) 12/13/2020   HDL 29 (L) 12/13/2020   LDLCALC 160 (H) 12/13/2020   LDLDIRECT 133 (H) 02/04/2021   TRIG 204 (H) 03/28/2021   CHOLHDL 10.5 (H) 12/13/2020   HbA1c, POC (controlled diabetic range)  Date Value Ref Range Status  12/13/2020 11.4 (A) 0.0 - 7.0 % Final  11/07/2018 12.9 (A) 0.0 - 7.0 % Final  01/14/2018 13.3 (A) 0.0 - 7.0 % Final   Hgb A1c MFr Bld  Date Value Ref Range Status  03/28/2021 6.4 (H) 4.8 - 5.6 % Final    Comment:             Prediabetes: 5.7 - 6.4          Diabetes: >6.4          Glycemic  control for adults with diabetes: <7.0    Lab Results  Component Value Date   UNHRVACQ58 483 12/13/2020   Lab Results  Component Value Date   TSH 1.750 03/28/2021       ASSESSMENT AND PLAN  36 y.o. year old adult here with diabetes here for evaluation of bilateral foot drop weakness, numbness, low back pain.   Dx:  1.  Foot drop, bilateral   2. Diabetic polyneuropathy associated with type 2 diabetes mellitus (HCC)       PLAN:  BILATERAL FOOT DROP WEAKNESS, NUMBNESS (most likely related to diabetic neuropathy) - check MRI lumbar spine (rule out spinal stenosis) - continue diabetes control - refer to PT evaluation  Orders Placed This Encounter  Procedures   MR LUMBAR SPINE [place]   Ambulatory referral to Physical Therapy   Return for return to PCP, return to referring provider.    Penni Bombard, MD 09/12/5730, 2:56 PM Certified in Neurology, Neurophysiology and Neuroimaging  [facility] [address], [place], [place] [address] [phone]

## 2021-05-24 NOTE — Patient Instructions (Signed)
BILATERAL FOOT DROP WEAKNESS, NUMBNESS (most likely related to diabetic neuropathy) - check MRI lumbar spine (rule out spinal stenosis) - refer to PT evaluation

## 2021-05-26 ENCOUNTER — Telehealth: Payer: Self-pay | Admitting: Diagnostic Neuroimaging

## 2021-05-26 NOTE — Telephone Encounter (Signed)
LVM for pt to call back to call back to schedule  [employer] auth: [employer] spoke to [place] Ref # [id number]

## 2021-05-27 ENCOUNTER — Ambulatory Visit: Payer: BC Managed Care – PPO | Admitting: Family Medicine

## 2021-05-27 ENCOUNTER — Other Ambulatory Visit: Payer: Self-pay

## 2021-05-27 ENCOUNTER — Encounter: Payer: Self-pay | Admitting: Family Medicine

## 2021-05-27 VITALS — BP 130/78 | HR 102 | Wt 221.0 lb

## 2021-05-27 DIAGNOSIS — Z9189 Other specified personal risk factors, not elsewhere classified: Secondary | ICD-10-CM

## 2021-05-27 DIAGNOSIS — I10 Essential (primary) hypertension: Secondary | ICD-10-CM

## 2021-05-27 DIAGNOSIS — E11319 Type 2 diabetes mellitus with unspecified diabetic retinopathy without macular edema: Secondary | ICD-10-CM

## 2021-05-27 DIAGNOSIS — D75839 Thrombocytosis, unspecified: Secondary | ICD-10-CM | POA: Diagnosis not present

## 2021-05-27 DIAGNOSIS — Z113 Encounter for screening for infections with a predominantly sexual mode of transmission: Secondary | ICD-10-CM | POA: Diagnosis not present

## 2021-05-27 DIAGNOSIS — R109 Unspecified abdominal pain: Secondary | ICD-10-CM | POA: Diagnosis not present

## 2021-05-27 DIAGNOSIS — Z789 Other specified health status: Secondary | ICD-10-CM

## 2021-05-27 MED ORDER — POLYETHYLENE GLYCOL 3350 17 GM/SCOOP PO POWD
17.0000 g | Freq: Two times a day (BID) | ORAL | 1 refills | Status: DC | PRN
Start: 2021-05-27 — End: 2022-05-24

## 2021-05-27 MED ORDER — METOCLOPRAMIDE HCL 5 MG PO TABS: 5.0000 mg | ORAL_TABLET | Freq: Two times a day (BID) | ORAL | 0 refills | Status: DC

## 2021-05-27 NOTE — Assessment & Plan Note (Signed)
Discussed the patient is interested in estrogen patches in the future.  Will send MyChart message with information at follow-up.  At this point given her very well controlled hypertension marked improvement in diabetes we will discuss potential side effects and risks of therapy and cautiously start pending patient preference.

## 2021-05-27 NOTE — Assessment & Plan Note (Signed)
Monitoring labs today

## 2021-05-27 NOTE — Patient Instructions (Addendum)
It was wonderful to see you today.  Please bring ALL of your medications with you to every visit.   Today we talked about:  --Checking your liver function  - STOP Zofran  - Start reglan 2 times per day before meals  - Monitor for side effects  - Use miralax twice per day---1 capful TWICE per day for 1 week  - Message me with how you feel next week  - I will send in Truvada pending lab results - I will talk to Dr. Nicholaus Bloom about Greggory Keen   - You will be called by Gastroenterology    Thank you for choosing [facility].   Please call [phone] with any questions about today's appointment.  Please be sure to schedule follow up at the front  desk before you leave today.   Terisa Starr, MD  Family Medicine

## 2021-05-27 NOTE — Assessment & Plan Note (Signed)
Current regimen includes metformin, SGLT2 inhibitor, Mounjaro and Lantus 14 units.  I am concerned that some of her abdominal symptoms are related to [facility].  Will route to pharmacist with recommendation for reducing dose.  The patient is due for refill.  Advise she skip her dose this week and given the severity of her abdominal symptoms.  BMP today to assess for potential acidosis without hyperglycemia due to SGLT2 inhibitor.

## 2021-05-27 NOTE — Assessment & Plan Note (Signed)
At goal doing well, metabolic panel today.

## 2021-05-27 NOTE — Progress Notes (Signed)
SUBJECTIVE:   CHIEF COMPLAINT: abdominal pain, neuropathy  HPI:   Carl [name] is a 36 y.o.  with history notable for type 2 DM  presenting for follow up and several new problems.   The patient reports a year-long history of worsening stomach symptoms.  She reports that any time after she eats regardless of the food even if it is a liquid she has the onset of sweating, heartburn nausea and intermittently vomits.  She has been taking Zofran which helps with some of the symptoms but not all.  She has infrequent stools and has started stools about once or twice a week at most.  No melena or hematochezia.  She is trying to lose weight.  She was recently started and uptitrated on [place].  She has never had stomach surgery.  She is passing flatus.  She denies abdominal pain today.  She reports the symptoms fluctuate but have really worsened over the past few months.   The patient reports compliance with her PrEP therapy.  She denies signs or symptoms of infection today.  The patient reports ongoing neuropathic pain.  She reports she has an upcoming MRI.  She is interested in finding an answer to the cause of her neuropathic pain symptoms.  Patient denies hyper or hypoglycemia.  She does like the mounjaro and is due for refill.  She understands that her symptoms of gastroparesis like pathology could be related to this medication. Medications:  Current Outpatient Medications:    metoCLOPramide (REGLAN) 5 MG tablet, Take 1 tablet (5 mg total) by mouth in the morning and at bedtime., Disp: 60 tablet, Rfl: 0   polyethylene glycol powder (GLYCOLAX/MIRALAX) 17 GM/SCOOP powder, Take 17 g by mouth 2 (two) times daily as needed., Disp: 3350 g, Rfl: 1   Accu-Chek Softclix Lancets lancets, Use as instructed, Disp: 100 each, Rfl: 12   albuterol (VENTOLIN HFA) 108 (90 Base) MCG/ACT inhaler, Inhale 2 puffs into the lungs every 6 (six) hours as needed for wheezing or shortness of breath., Disp: 18 g, Rfl: 3    amLODipine (NORVASC) 5 MG tablet, Take 1 tablet (5 mg total) by mouth at bedtime., Disp: 90 tablet, Rfl: 3   atorvastatin (LIPITOR) 20 MG tablet, Take 1 tablet (20 mg total) by mouth at bedtime., Disp: 90 tablet, Rfl: 3   benzonatate (TESSALON) 100 MG capsule, Take 1 capsule (100 mg total) by mouth 2 (two) times daily as needed for cough., Disp: 20 capsule, Rfl: 0   Blood Glucose Monitoring Suppl (ACCU-CHEK AVIVA PLUS) w/Device KIT, Use to check blood glucose once daily before injecting insulin, Disp: 1 kit, Rfl: 0   cetirizine (ZYRTEC) 10 MG tablet, Take 10 mg by mouth daily., Disp: , Rfl:    Continuous Blood Gluc Sensor (FREESTYLE LIBRE 3 SENSOR) MISC, 1 application by Does not apply route as directed. Apply one sensor every 14 days, Disp: 2 each, Rfl: 2   dapagliflozin propanediol (FARXIGA) 10 MG TABS tablet, Take 1 tablet (10 mg total) by mouth daily before breakfast., Disp: 30 tablet, Rfl: 2   emtricitabine-tenofovir (TRUVADA) 200-300 MG tablet, Take 1 tablet by mouth daily., Disp: 90 tablet, Rfl: 0   esomeprazole (NEXIUM) 20 MG capsule, Take 20 mg by mouth daily at 12 noon., Disp: , Rfl:    fluticasone (FLONASE) 50 MCG/ACT nasal spray, Place 2 sprays into both nostrils daily., Disp: 16 g, Rfl: 0   fluticasone furoate-vilanterol (BREO ELLIPTA) 100-25 MCG/INH AEPB, Inhale 1 puff into the lungs daily., Disp: 60  each, Rfl: 2   glucose blood (ACCU-CHEK ACTIVE STRIPS) test strip, Use as instructed, Disp: 100 each, Rfl: 12   glucose blood test strip, Use as instructed, Disp: 100 each, Rfl: 1   ibuprofen (ADVIL) 800 MG tablet, Take 200 mg by mouth every 8 (eight) hours as needed., Disp: , Rfl:    insulin glargine (LANTUS) 100 UNIT/ML Solostar Pen, Inject 14 Units into the skin daily., Disp: 15 mL, Rfl: 1   Insulin Pen Needle (NOVOFINE) 30G X 8 MM MISC, Use to inject insulin daily, Disp: 100 each, Rfl: 11   losartan (COZAAR) 50 MG tablet, Take 1 tablet (50 mg total) by mouth at bedtime., Disp: 90  tablet, Rfl: 3   metFORMIN (GLUCOPHAGE XR) 750 MG 24 hr tablet, Take 1 tablet (750 mg total) by mouth daily with breakfast., Disp: 30 tablet, Rfl: 2   Multiple Vitamins-Minerals (MULTIVITAMIN WITH MINERALS) tablet, Take 1 tablet by mouth daily., Disp: , Rfl:    ondansetron (ZOFRAN ODT) 4 MG disintegrating tablet, Take 1 tablet (4 mg total) by mouth every 8 (eight) hours as needed for nausea or vomiting., Disp: 20 tablet, Rfl: 0   SUMAtriptan (IMITREX) 25 MG tablet, Take 1 tablet (25 mg total) by mouth once for 1 dose. May repeat in 2 hours if headache persists or recurs., Disp: 10 tablet, Rfl: 0   tirzepatide (MOUNJARO) 10 MG/0.5ML Pen, Inject 10 mg into the skin once a week., Disp: 2 mL, Rfl: 0    PERTINENT  PMH / PSH/Family/Social History : Updated and reviewed  OBJECTIVE:   BP 130/78    Pulse (!) 102    Wt 221 lb (100.2 kg)    BMI 31.71 kg/m   Today's weight:  Last Weight  Most recent update: 05/27/2021  9:03 AM    Weight  100.2 kg (221 lb)            Review of prior weights: [employer]   05/27/21 0903  Weight: 221 lb (100.2 kg)    Well-appearing woman in no distress.  She is alert and oriented.  Not diaphoretic on exam.  Pleasant and appropriate throughout.  Cardiac exam regular rate and rhythm heart rates in the 90s on my exam.  Lungs are clear.  Abdomen is minimally distended.  Soft nontender no right upper quadrant tenderness lower extremities are without edema or ulceration.  ASSESSMENT/PLAN:   Essential hypertension At goal doing well, metabolic panel today.  Diabetes ([place]) Current regimen includes metformin, SGLT2 inhibitor, Mounjaro and Lantus 14 units.  I am concerned that some of her abdominal symptoms are related to [facility].  Will route to pharmacist with recommendation for reducing dose.  The patient is due for refill.  Advise she skip her dose this week and given the severity of her abdominal symptoms.  BMP today to assess for potential acidosis without  hyperglycemia due to SGLT2 inhibitor.  PREP Therapy  Monitoring labs today   Prefers Male Pronouns--- [name]  Discussed the patient is interested in estrogen patches in the future.  Will send MyChart message with information at follow-up.  At this point given her very well controlled hypertension marked improvement in diabetes we will discuss potential side effects and risks of therapy and cautiously start pending patient preference.  Nausea and vomiting patient believes she has gastroparesis, her symptoms do seem consistent with this.  I am concerned this is related to her medication.  We will need to either reduce or eliminate this medication as the cause.  We  will start Reglan to see if this helps.  To expand differential we will obtain right upper quadrant ultrasound and routine labs.  Could also consider nonketotic acidosis related to SGLT2 inhibitor.  CBC today as well.  Miralax BID for constipation. We will message with results. Reviewed return precautions.    HCM- Up to date, has received COVID vaccines     Dorris Singh, MD  [place]

## 2021-05-28 LAB — COMPREHENSIVE METABOLIC PANEL
ALT: 39 IU/L (ref 0–44)
AST: 26 IU/L (ref 0–40)
Albumin/Globulin Ratio: 2.7 — ABNORMAL HIGH (ref 1.2–2.2)
Albumin: 5.1 g/dL — ABNORMAL HIGH (ref 4.0–5.0)
Alkaline Phosphatase: 92 IU/L (ref 44–121)
BUN/Creatinine Ratio: 20 (ref 9–20)
BUN: 17 mg/dL (ref 6–20)
Bilirubin Total: 0.3 mg/dL (ref 0.0–1.2)
CO2: 22 mmol/L (ref 20–29)
Calcium: 9.9 mg/dL (ref 8.7–10.2)
Chloride: 102 mmol/L (ref 96–106)
Creatinine, Ser: 0.83 mg/dL (ref 0.76–1.27)
Globulin, Total: 1.9 g/dL (ref 1.5–4.5)
Glucose: 108 mg/dL — ABNORMAL HIGH (ref 70–99)
Potassium: 4.5 mmol/L (ref 3.5–5.2)
Sodium: 142 mmol/L (ref 134–144)
Total Protein: 7 g/dL (ref 6.0–8.5)
eGFR: 119 mL/min/{1.73_m2} (ref >59)

## 2021-05-28 LAB — CBC
Hematocrit: 45.5 % (ref 37.5–51.0)
Hemoglobin: 15.5 g/dL (ref 13.0–17.7)
MCH: 29.1 pg (ref 26.6–33.0)
MCHC: 34.1 g/dL (ref 31.5–35.7)
MCV: 86 fL (ref 79–97)
Platelets: 323 10*3/uL (ref 150–450)
RBC: 5.32 x10E6/uL (ref 4.14–5.80)
RDW: 13.8 % (ref 11.6–15.4)
WBC: 7.4 10*3/uL (ref 3.4–10.8)

## 2021-05-28 LAB — HCV AB W REFLEX TO QUANT PCR: HCV Ab: <0.1 s/co ratio (ref 0.0–0.9)

## 2021-05-28 LAB — HCV INTERPRETATION

## 2021-05-28 LAB — HIV ANTIBODY (ROUTINE TESTING W REFLEX): HIV Screen 4th Generation wRfx: NONREACTIVE

## 2021-05-28 LAB — RPR: RPR Ser Ql: NONREACTIVE

## 2021-05-28 LAB — HEPATITIS B SURFACE ANTIGEN: Hepatitis B Surface Ag: NEGATIVE

## 2021-05-28 MED ORDER — EMTRICITABINE-TENOFOVIR DF 200-300 MG PO TABS: 1.0000 | ORAL_TABLET | Freq: Every day | ORAL | 0 refills | Status: DC

## 2021-05-28 NOTE — Addendum Note (Signed)
Addended by: Manson Passey, Armando Lauman on: 05/28/2021 08:27 AM   Modules accepted: Orders

## 2021-05-28 NOTE — Progress Notes (Signed)
Prep monitoring labs returned, Rx for Truvada for 90 days to pharmacy. Will send message.

## 2021-06-01 ENCOUNTER — Encounter: Payer: Self-pay | Admitting: Family Medicine

## 2021-06-01 ENCOUNTER — Other Ambulatory Visit: Payer: Self-pay

## 2021-06-01 ENCOUNTER — Telehealth (INDEPENDENT_AMBULATORY_CARE_PROVIDER_SITE_OTHER): Payer: BC Managed Care – PPO | Admitting: Pharmacist

## 2021-06-01 ENCOUNTER — Telehealth: Payer: Self-pay | Admitting: Family Medicine

## 2021-06-01 DIAGNOSIS — E11319 Type 2 diabetes mellitus with unspecified diabetic retinopathy without macular edema: Secondary | ICD-10-CM | POA: Diagnosis not present

## 2021-06-01 NOTE — Progress Notes (Signed)
Subjective:    Patient ID: Carl [name], adult    DOB: Mar 30, 1986, 37 y.o.   MRN: [id number]  HPI Patient is a 36 y.o. male who presents for diabetes management. She is in good spirits and presents without assistance. Patient was referred on 12/13/20 and last seen by Primary Care Provider on 05/27/21. Last seen in pharmacy clinic on 04/27/21.  I connected with patient by a video enabled telemedicine application and verified that I am speaking with the correct person using two identifiers.   I discussed the limitations of evaluation and management by telemedicine. The patient expressed understanding and agreed to proceed. Patient located at work and provider located at office.  Patient reports she held her [facility] per Dr. Saul Fordyce instructions at last office visit due to ongoing nausea/vomiting. The nausea/vomiting has been happening before GLP1 initiation but concerns that this could be worsening symptoms. Patient has referral for GI but they have not reached out to schedule an appointment as of yet.   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 Patient reports adherence with medications.  Do you feel that your medications are working for you?  no  Have you been experiencing any side effects to the medications prescribed? Yes; reports metformin causes bloat and some flatulence but states it is tolerable  Do you have any problems obtaining medications due to transportation or finances?  no    Patient reported dietary habits:  Eats 3 meals/day and 1  snacks/day Breakfast: protein shake or fiber 1 bars Lunch: low carb frozen meals Dinner: low carb frozen meals Snacks: sometimes yes sometimes no (popcorn)  Drinks: water; diet soda  Patient-reported exercise habits: Cardio (bike) at least 30 minutes with goal of getting to 3x/week but has difficulty with leg pains   Patient reports hypoglycemic events. Patient denies polyuria (increased urination).  Patient denies polyphagia (increased appetite).  Patient denies polydipsia (increased thirst).  Patient reports neuropathy (nerve pain); numbness in legs Patient denies visual changes. Patient reports self foot exams.   Freestyle Libre 3.0 patient education Patient taking >500 mg Vitamin C: denies Reminded patient to not take Vitamin C with Freestyle Libre.  Sensor application  1. Site selection and site prep with alcohol pad/Skin Tac 2. Sensor prep-sensor pack and sensor applicator 3. Starting the sensor: 1 hour warm up before BG readings available     Will ask for fingersticks the first 12 hours   4. Sensor change every 14 days and rotate site 5. Call [employer] customer service if sensor comes off before 14 days  Safety and Troubleshooting 1. Scan the sensor at least every 8 hours 2. When the "test BG" symbol appears, test fingerstick blood glucose prior to    making treatment decisions 3. Do a fingerstick blood glucose test if the sensor readings do not match how    you feel 4. Remove sensor prior for MRI or CT. Sensor may be damaged by exposure to    airport x-ray screening 5. Vitamin C may cause false high readings and aspirin may cause false low     readings 6. Store sensor kit between 39 and 77 degrees. Can be refrigerated at this temp.  Contact information  provided for [employer] customer service and/or trainer.  Objective:   CGM report from last 14 days    Labs:   Physical Exam Neurological:     Mental Status: She is alert and oriented to person, place, and time.     Lab Results  Component Value Date   HGBA1C 6.4 (H) 03/28/2021   HGBA1C 11.4 (A) 12/13/2020   HGBA1C 12.9 (A) 11/07/2018    Lipid Panel     Component Value Date/Time   CHOL 304 (H) 12/13/2020 0917   TRIG 204 (H) 03/28/2021 0928   HDL 29 (L) 12/13/2020 0917   CHOLHDL 10.5 (H) 12/13/2020 0917   LDLCALC 160 (H) 12/13/2020 0917   LDLDIRECT 133 (H) 02/04/2021 1004    Clinical Atherosclerotic Cardiovascular Disease (ASCVD): No  The ASCVD Risk score (Arnett DK, et al., 2019) failed to calculate for the following reasons:   The 2019 ASCVD risk score is only valid for ages 60 to 21    Assessment/Plan:   T2DM controlled likely due to frequent adjustments in regimen and optimal adherence to medication. Patient tolerating current regimen well. Despite patient holding Mounjaro blood glucose readings continue to remain well controlled. Will have patient continue to hold for two weeks and re-assess nausea/vomiting as well as blood glucose readings. May consider resuming at lower dose. Following instruction patient verbalized understanding of treatment plan.   Continued basal insulin glargine (Lantus) 14 units daily Hold Mounjaro 60m once weekly on Thursdays Continued Farxiga 188monce daily Continued metformin XR to 75087mnce daily with breakfast Extensively discussed pathophysiology of diabetes, dietary effects on blood sugar control, and recommended lifestyle interventions. Patient will work towards target of at least 150 minutes of moderate intensity exercise weekly Counseled on s/sx of and management of hypoglycemia Next A1C anticipated February 2023  Follow-up appointment 2 weeks to review sugar readings. Written patient instructions provided.  This appointment required 20 minutes of non-face-to-face direct patient care.  Thank you for involving pharmacy to assist in providing this patient's care.

## 2021-06-01 NOTE — Telephone Encounter (Signed)
Attempted to call patient. Reached voicemail, left generic voicemail to call back. If calls back, please find out good time to review results--overall normal, I would like to know how she is feeling (nausea/vomiting).   Terisa Starr, MD  Family Medicine Teaching Service

## 2021-06-02 ENCOUNTER — Encounter: Payer: Self-pay | Admitting: Pharmacist

## 2021-06-02 NOTE — Assessment & Plan Note (Signed)
T2DM controlled likely due to frequent adjustments in regimen and optimal adherence to medication. Patient tolerating current regimen well. Despite patient holding Mounjaro blood glucose readings continue to remain well controlled. Will have patient continue to hold for two weeks and re-assess nausea/vomiting as well as blood glucose readings. May consider resuming at lower dose. Following instruction patient verbalized understanding of treatment plan.   1. Continued basal insulin glargine (Lantus) 14 units daily 2. Hold Mounjaro 10mg  once weekly on Thursdays 3. Continued Farxiga 10mg  once daily 4. Continued metformin XR to 750mg  once daily with breakfast 5. Extensively discussed pathophysiology of diabetes, dietary effects on blood sugar control, and recommended lifestyle interventions. 6. Patient will work towards target of at least 150 minutes of moderate intensity exercise weekly 7. Counseled on s/sx of and management of hypoglycemia 8. Next A1C anticipated February 2023  Follow-up appointment 2 weeks to review sugar readings

## 2021-06-03 ENCOUNTER — Ambulatory Visit (HOSPITAL_COMMUNITY): Payer: BC Managed Care – PPO

## 2021-06-07 ENCOUNTER — Other Ambulatory Visit: Payer: Self-pay

## 2021-06-07 ENCOUNTER — Ambulatory Visit
Admission: RE | Admit: 2021-06-07 | Discharge: 2021-06-07 | Disposition: A | Discharge status: Home / Self Care | Payer: BC Managed Care – PPO | Source: Ambulatory Visit | Attending: Family Medicine | Admitting: Family Medicine

## 2021-06-07 DIAGNOSIS — R109 Unspecified abdominal pain: Secondary | ICD-10-CM | POA: Insufficient documentation

## 2021-06-07 IMAGING — US US ABDOMEN LIMITED
1 series · 14 of 25 positions shown · non-contrast
Comparison: [DATE]

CLINICAL DATA: Intermittent abdominal pain

EXAM:
ULTRASOUND ABDOMEN LIMITED RIGHT UPPER QUADRANT

[Series 1: us abdomen limited · 0.25mm/px · 14 of 49 slices shown]
[im 1/49]
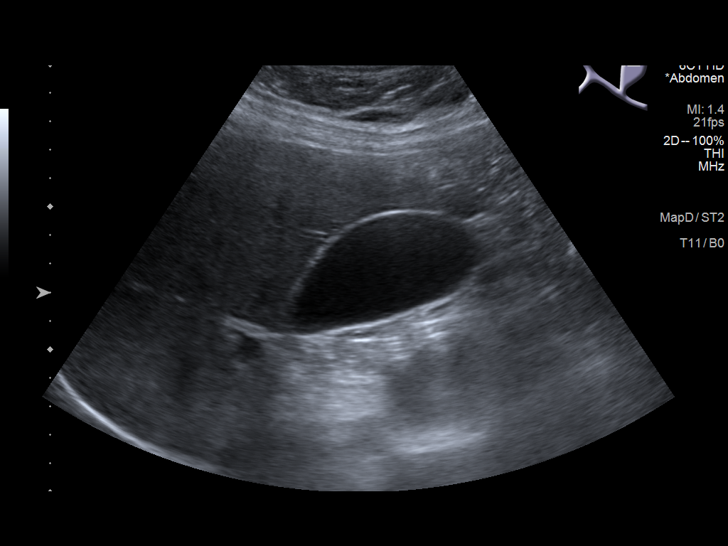
[im 5/49]
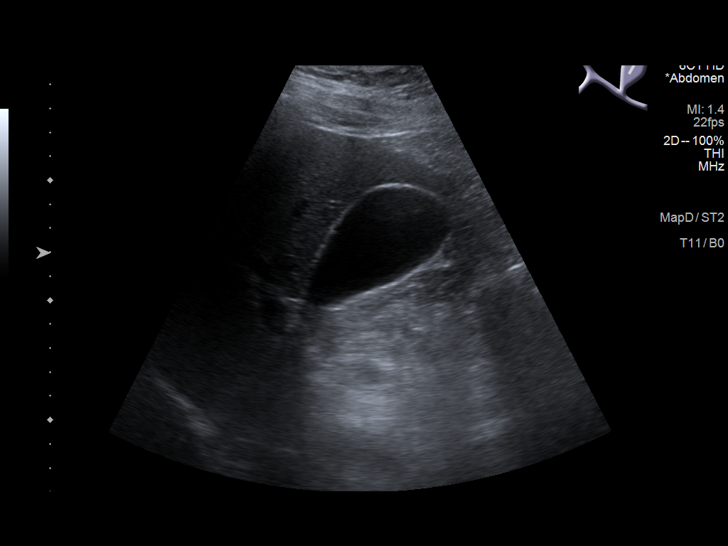
[im 9/49]
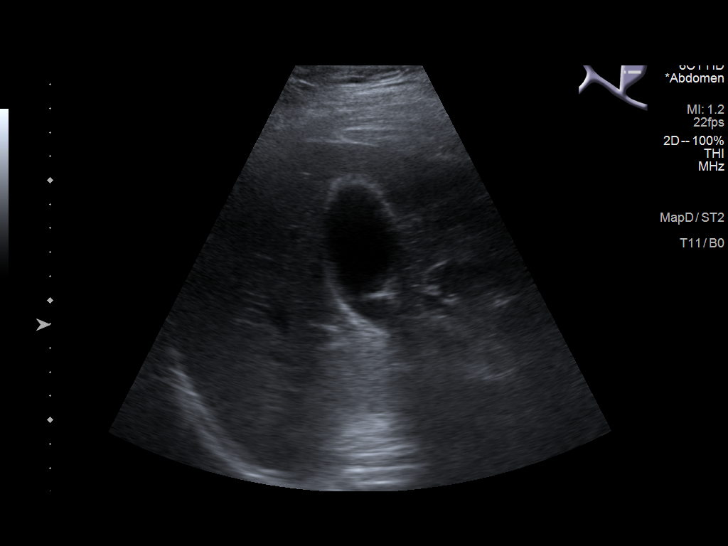
[im 13/49]
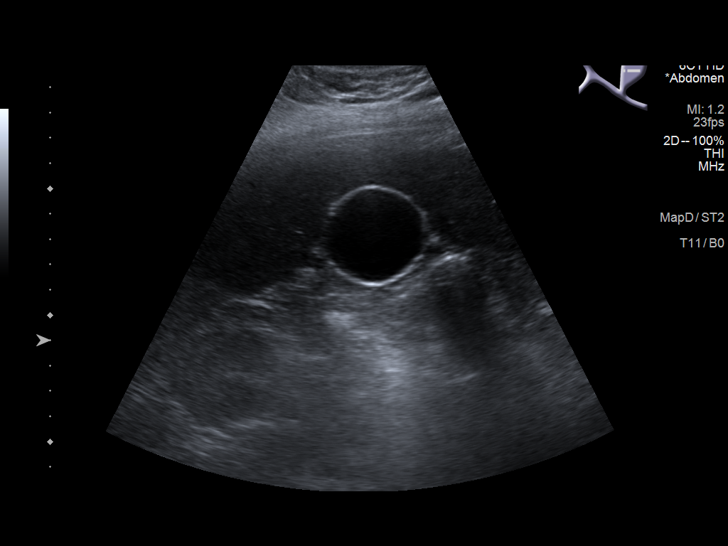
[im 17/49]
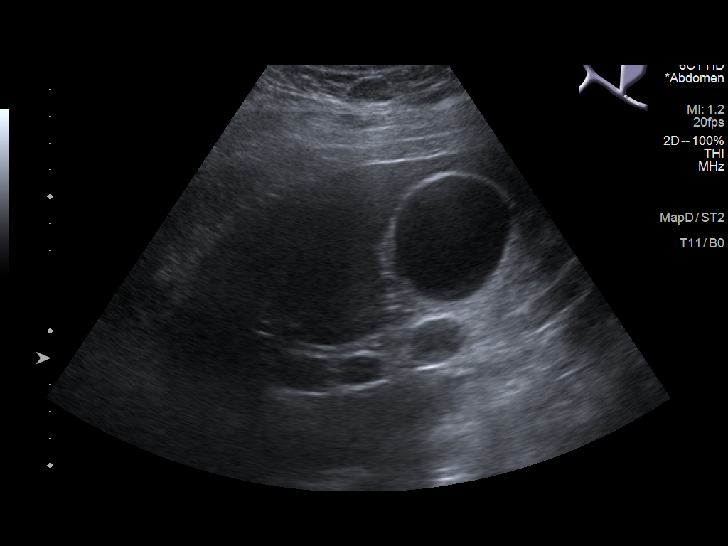
[im 19/49]
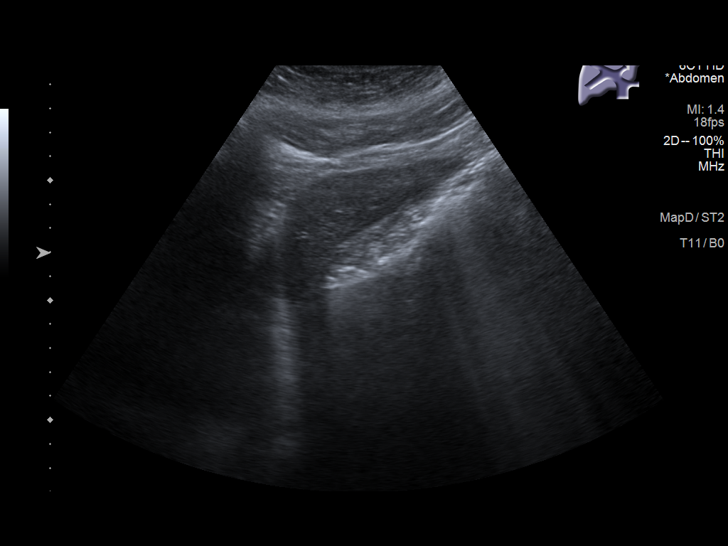
[im 23/49]
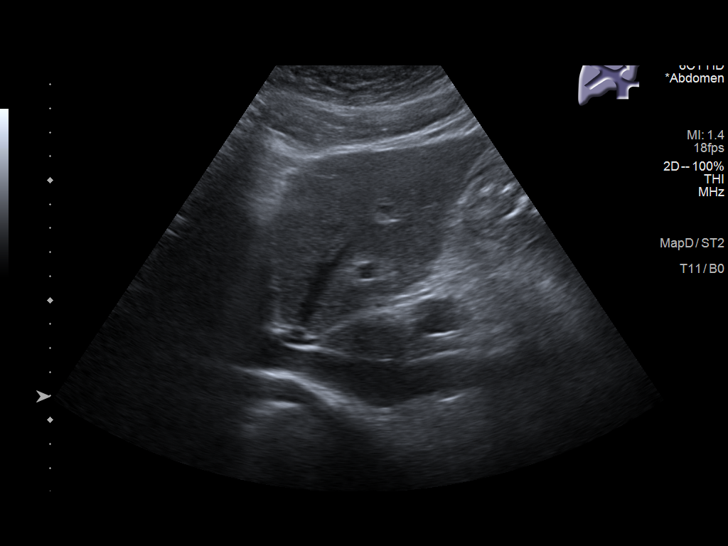
[im 27/49]
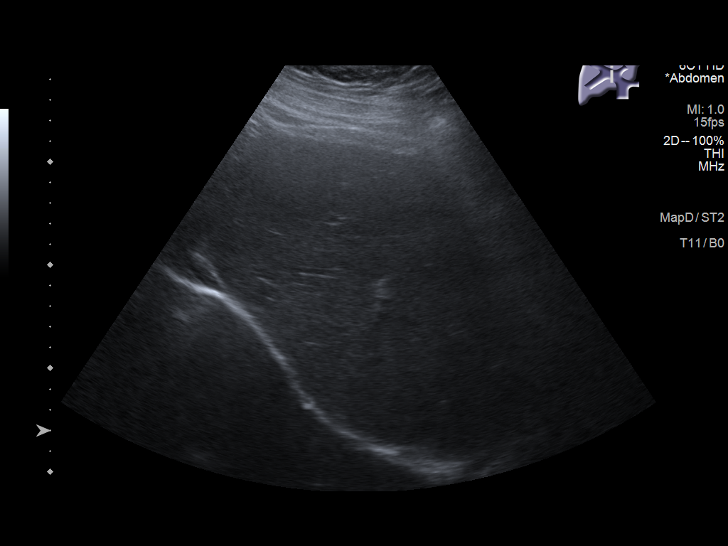
[im 31/49]
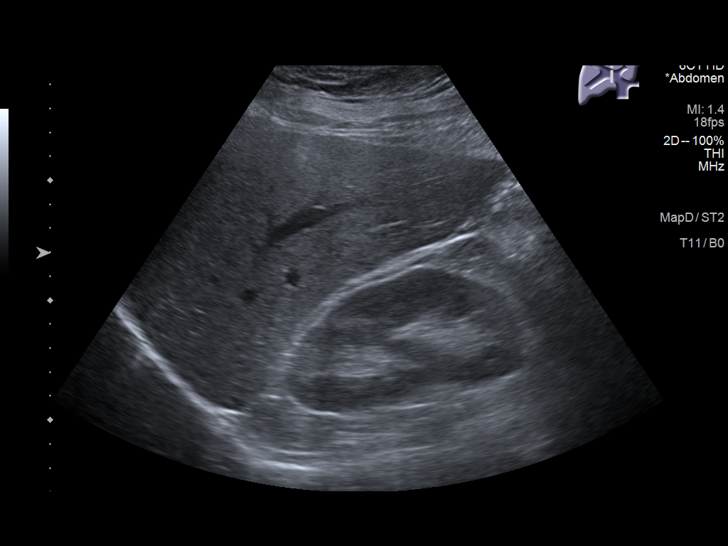
[im 33/49]
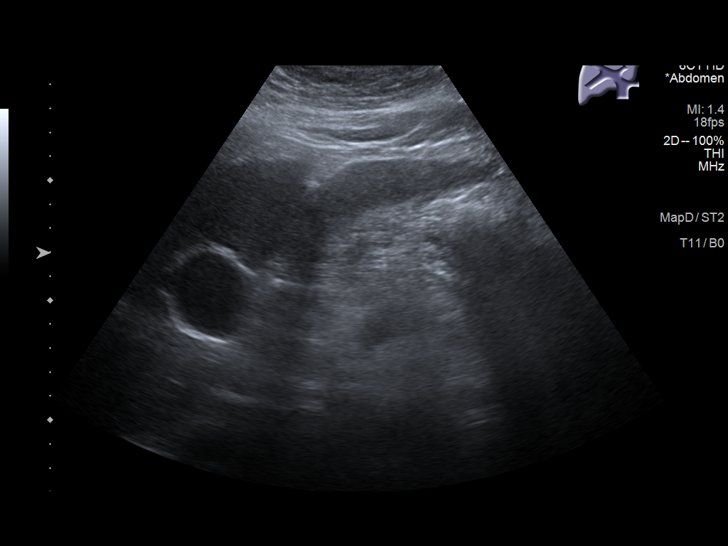
[im 37/49]
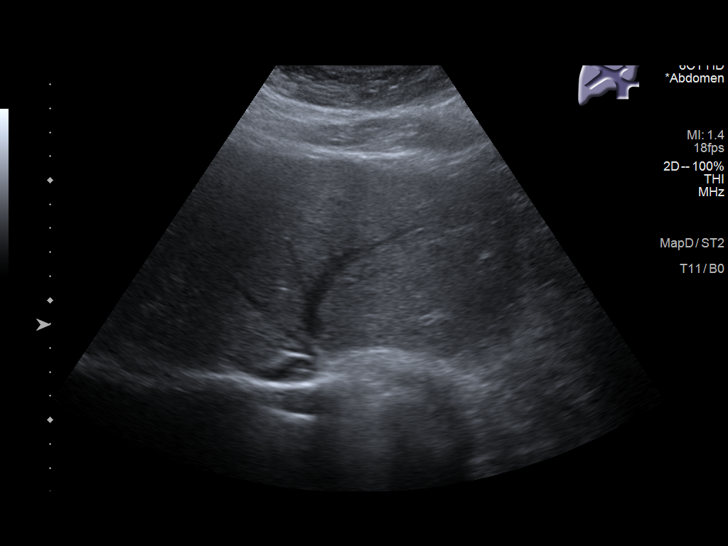
[im 41/49]
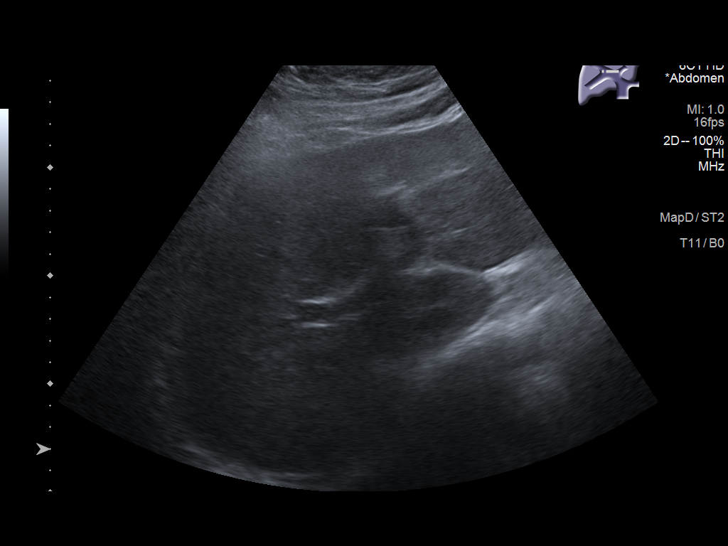
[im 45/49]
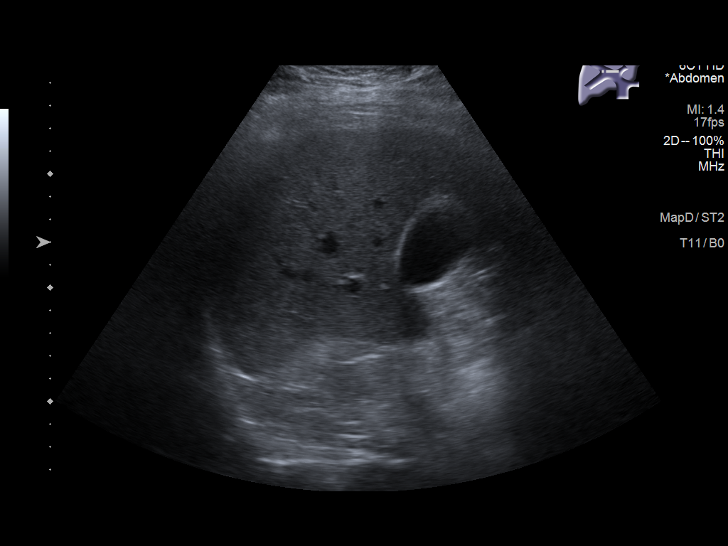
[im 49/49]
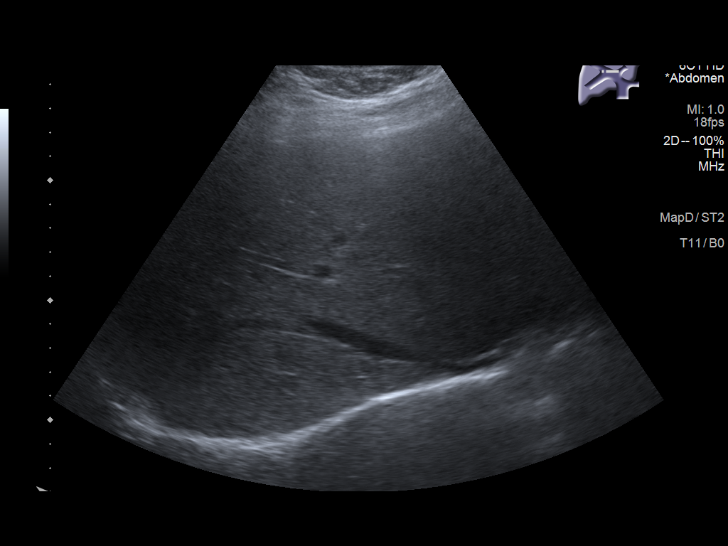

[14 of 25 positions shown; findings below may reference images not displayed]

FINDINGS: Gallbladder:

No gallstones or wall thickening visualized. No sonographic Murphy
sign noted by sonographer.

Common bile duct:

Diameter: 2.0 cm.

Liver:

No focal lesion identified. Within normal limits in parenchymal
echogenicity. Portal vein is patent on color Doppler imaging with
normal direction of blood flow towards the liver.

Other: None.
IMPRESSION: No acute abnormality noted.

## 2021-06-07 NOTE — Telephone Encounter (Signed)
I tried to leave a voicemail but his phone would not let me, it just states message has been erased.

## 2021-06-08 ENCOUNTER — Telehealth: Payer: Self-pay | Admitting: Family Medicine

## 2021-06-08 DIAGNOSIS — R1013 Epigastric pain: Secondary | ICD-10-CM

## 2021-06-08 DIAGNOSIS — R112 Nausea with vomiting, unspecified: Secondary | ICD-10-CM

## 2021-06-08 DIAGNOSIS — E11319 Type 2 diabetes mellitus with unspecified diabetic retinopathy without macular edema: Secondary | ICD-10-CM

## 2021-06-08 NOTE — Telephone Encounter (Signed)
Attempted to call patient. Reached voicemail, left generic voicemail to call back.    If patient calls back, please let [name] known her ultrasound shows her gallbladder is normal. I recommend that we get her to gastroenterology for evaluation and possible emptying study.   Dorris Singh, MD  Family Medicine Teaching Service

## 2021-06-13 ENCOUNTER — Other Ambulatory Visit: Payer: Self-pay | Admitting: *Deleted

## 2021-06-13 MED ORDER — DAPAGLIFLOZIN PROPANEDIOL 10 MG PO TABS: 10.0000 mg | ORAL_TABLET | Freq: Every day | ORAL | 2 refills | Status: DC

## 2021-06-13 MED ORDER — FREESTYLE LIBRE 3 SENSOR MISC: 1.0000 "application " | 2 refills | Status: DC

## 2021-06-13 NOTE — Telephone Encounter (Signed)
Left voicemail for pt to call back to schedule.

## 2021-06-15 ENCOUNTER — Other Ambulatory Visit: Payer: Self-pay

## 2021-06-15 ENCOUNTER — Telehealth (INDEPENDENT_AMBULATORY_CARE_PROVIDER_SITE_OTHER): Payer: BC Managed Care – PPO | Admitting: Pharmacist

## 2021-06-15 DIAGNOSIS — E11319 Type 2 diabetes mellitus with unspecified diabetic retinopathy without macular edema: Secondary | ICD-10-CM | POA: Diagnosis not present

## 2021-06-15 MED ORDER — METFORMIN HCL ER 750 MG PO TB24: 750.0000 mg | ORAL_TABLET | Freq: Two times a day (BID) | ORAL | 0 refills | Status: DC

## 2021-06-15 NOTE — Assessment & Plan Note (Signed)
T2DM previously well controlled but has seen slight increase in blood glucose readings due to having to discontinue all GLP1 until patient sees GI for work-up. At this time patient agreeable to try metformin XR 750 twice daily as opposed to once daily. Will also increase patient's Lantus from 24 units ot 26 units. Patient will send mychart message on Monday to let me know how she is tolerating increase in metformin. Will re-assess any additional or alternative therapy changes then. Following instruction patient verbalized understanding of treatment plan.   1. Increased dose of basal insulin glargine (Lantus) to 26 units daily 2. Hold Mounjaro 10mg  once weekly on Thursdays 3. Continued Farxiga 10mg  once daily 4. Increased dose of metformin XR to 750mg  twice daily 5. Extensively discussed pathophysiology of diabetes, dietary effects on blood sugar control, and recommended lifestyle interventions. 6. Patient will work towards target of at least 150 minutes of moderate intensity exercise weekly 7. Counseled on s/sx of and management of hypoglycemia 8. Next A1C anticipated at next in person office visit

## 2021-06-15 NOTE — Progress Notes (Signed)
Subjective:    Patient ID: Carl [name], adult    DOB: 1985-05-30, 36 y.o.   MRN: [id number]  HPI Patient is a 36 y.o. male who presents for diabetes management. She is in good spirits and presents without assistance. Patient was referred on 12/13/20 and last seen by Primary Care Provider on 05/27/21. Last seen in pharmacy clinic on 06/01/21.  I connected with patient by a video enabled telemedicine application and verified that I am speaking with the correct person using two identifiers.   I discussed the limitations of evaluation and management by telemedicine. The patient expressed understanding and agreed to proceed. Patient located in vehicle and provider located at office.  Patient reports she has continued to hold her Darcel Bayley due to concerns regarding longstanding nausea/vomiting prior to GLP initiation that may be from gastroparesis. In the meantime she has continued to appropriately titrate insulin per Dr. Saul Fordyce instructions and is up to 24 units once daily. She reports her blood glucose readings have continued to be elevated and that this has lead to worsened mental health and not wanting to consume foods to avoid spiking her blood glucose. Patient has referral for GI but they have not reached out to schedule an appointment as of yet.   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 Patient reports adherence with medications.  Do you feel that your medications are working for you?  no  Have you been experiencing any side effects to the medications prescribed? no  Do you have any problems  obtaining medications due to transportation or finances?  no    Patient reported dietary habits:  Eats 3 meals/day and 1 snacks/day Breakfast: protein shake or fiber 1 bars Lunch: low carb frozen meals Dinner: low carb frozen meals Snacks: sometimes yes sometimes no (popcorn)  Drinks: water; diet soda  Patient-reported exercise habits: Cardio (bike) at least 30 minutes with goal of getting to 3x/week but has difficulty with leg pains   Patient reports hypoglycemic events. Patient denies polyuria (increased urination).  Patient denies polyphagia (increased appetite).  Patient denies polydipsia (increased thirst).  Patient reports neuropathy (nerve pain); numbness in legs Patient denies visual changes. Patient reports self foot exams.   Freestyle Libre 3.0 patient education Patient taking >500 mg Vitamin C: denies Reminded patient to not take Vitamin C with Freestyle Libre.  Sensor application  1. Site selection and site prep with alcohol pad/Skin Tac 2. Sensor prep-sensor pack and sensor applicator 3. Starting the sensor: 1 hour warm up before BG readings available     Will ask for fingersticks the first 12 hours   4. Sensor change every 14 days and rotate site 5. Call [employer] customer service if sensor comes off before 14 days  Safety and Troubleshooting 1. Scan the sensor at least every 8 hours 2. When the "test BG" symbol appears, test fingerstick blood glucose prior to    making treatment decisions 3. Do a fingerstick blood glucose test if the sensor readings do not match how    you feel 4. Remove sensor prior for MRI or CT. Sensor may be damaged by exposure to    airport x-ray screening 5. Vitamin C may cause false high readings and aspirin  may cause false low     readings 6. Store sensor kit between 39 and 77 degrees. Can be refrigerated at this temp.  Contact information provided for [employer] customer service and/or trainer.  Objective:   CGM report from last 14  days    Labs:   Physical Exam Neurological:     Mental Status: She is alert and oriented to person, place, and time.   Review of Systems  Gastrointestinal:  Positive for nausea and vomiting.    Lab Results  Component Value Date   HGBA1C 6.4 (H) 03/28/2021   HGBA1C 11.4 (A) 12/13/2020   HGBA1C 12.9 (A) 11/07/2018    Lipid Panel     Component Value Date/Time   CHOL 304 (H) 12/13/2020 0917   TRIG 204 (H) 03/28/2021 0928   HDL 29 (L) 12/13/2020 0917   CHOLHDL 10.5 (H) 12/13/2020 0917   LDLCALC 160 (H) 12/13/2020 0917   LDLDIRECT 133 (H) 02/04/2021 1004    Clinical Atherosclerotic Cardiovascular Disease (ASCVD): No  The ASCVD Risk score (Arnett DK, et al., 2019) failed to calculate for the following reasons:   The 2019 ASCVD risk score is only valid for ages 5 to 51    Assessment/Plan:   T2DM previously well controlled but has seen slight increase in blood glucose readings due to having to discontinue all GLP1 until patient sees GI for work-up. At this time patient agreeable to try metformin XR 750 twice daily as opposed to once daily. Will also increase patient's Lantus from 24 units ot 26 units. Patient will send mychart message on Monday to let me know how she is tolerating increase in metformin. Will re-assess any additional or alternative therapy changes then. Following instruction patient verbalized understanding of treatment plan.   Increased dose of basal insulin glargine (Lantus) to 26 units daily Hold Mounjaro 82m once weekly on Thursdays Continued Farxiga 14monce daily Increased dose of metformin XR to 75070mwice daily Extensively discussed pathophysiology of diabetes, dietary effects on blood sugar control, and recommended lifestyle interventions. Patient will work towards target of at least 150 minutes of moderate intensity exercise weekly Counseled on s/sx of and management of hypoglycemia Next A1C anticipated at next in person office visit  Follow-up  phone call next week to review sugar readings. Written patient instructions provided.  This appointment required 17 minutes of non-face-to-face direct patient care.  Thank you for involving pharmacy to assist in providing this patient's care.

## 2021-06-21 ENCOUNTER — Other Ambulatory Visit: Payer: Self-pay

## 2021-06-21 ENCOUNTER — Ambulatory Visit: Payer: BC Managed Care – PPO | Attending: Diagnostic Neuroimaging | Admitting: Physical Therapy

## 2021-06-21 ENCOUNTER — Encounter: Payer: Self-pay | Admitting: Physical Therapy

## 2021-06-21 DIAGNOSIS — R2689 Other abnormalities of gait and mobility: Secondary | ICD-10-CM | POA: Diagnosis present

## 2021-06-21 DIAGNOSIS — R262 Difficulty in walking, not elsewhere classified: Secondary | ICD-10-CM | POA: Insufficient documentation

## 2021-06-21 DIAGNOSIS — M21371 Foot drop, right foot: Secondary | ICD-10-CM | POA: Insufficient documentation

## 2021-06-21 DIAGNOSIS — M21372 Foot drop, left foot: Secondary | ICD-10-CM | POA: Insufficient documentation

## 2021-06-21 DIAGNOSIS — M6281 Muscle weakness (generalized): Secondary | ICD-10-CM | POA: Diagnosis not present

## 2021-06-21 DIAGNOSIS — R2681 Unsteadiness on feet: Secondary | ICD-10-CM | POA: Insufficient documentation

## 2021-06-21 NOTE — Therapy (Signed)
[facility] [address] [place], [address] Phone: [phone]   Fax:  [phone]  Physical Therapy Evaluation  Patient Details  Name: Carl [name] MRN: [id number] Date of Birth: 1986-03-17 No data recorded  Encounter Date: 06/21/2021   PT End of Session - 06/21/21 1025     Visit Number 1    Number of Visits 17    Date for PT Re-Evaluation 08/16/21    Progress Note Due on Visit 10    PT Start Time 0732    PT Stop Time 0817    PT Time Calculation (min) 45 min    Activity Tolerance Patient tolerated treatment well    Behavior During Therapy WFL for tasks assessed/performed             Past Medical History:  Diagnosis Date   Diabetes mellitus without complication (HCC)    Dyslipidemia    Hypertension    Hypertension    Neuropathy     Past Surgical History:  Procedure Laterality Date   MOUTH SURGERY     WISDOM TOOTH EXTRACTION      There were no vitals filed for this visit.    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 She reports bilateral weakness and impaired sensation, left worse than right. Sensation changes started about 1 year ago while weakness and foot drop was noticed 3-4 months ago. Activity limitations include sitting, standing and walking due to progressive increase in pain/discomfort with prolonged positioning eventually transitioning into sharp shooting pains in the feet. Pt states by the time she is done grocery shopping (on her feet for ~1 hour), it feels like her left leg is going to give out. Due to this feeling, she has a fear of falling. She does endorse 1 fall last summer in which she was stepping off a curb and fell. Numbness/pain in feet does disrupt sleep. She also states  balance has become impaired with difficulty stepping from one surface to another, especially onto uneven terrain. Patient states she is in the process of being tested for gastroparesis. Denies red flags.    Limitations Sitting;Standing;Walking;Lifting;House hold activities    How long can you sit comfortably? 30 minutes    How long can you stand comfortably? 25-30 minutes    How long can you walk comfortably? ~1 hour    Patient Stated Goals She would like to be able to stand through an entire concert and be more independent/confident with walking (not worried about her left leg giving out).    Currently in Pain? Yes    Pain Score 1     Pain Location Foot    Pain Orientation Right;Left;Lower    Pain Descriptors / Indicators Discomfort;Numbness;Other (Comment)   "cold"   Pain Type Chronic pain    Pain Onset More than a month ago    Pain Frequency Constant    Aggravating Factors  prolonged sitting, standing, walking, sleeping    Pain Relieving Factors Changing positions frequently    Effect of Pain on Daily Activities limits social life participation    Multiple Pain Sites No            Foot drop, bilateral Diabetic polyneuropathy associated with type 2 diabetes   SUBJECTIVE Chief complaint: 36 y/o patient identifying as  male. 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 She reports bilateral weakness and impaired sensation, left worse than right. Sensation changes started about 1 year ago while weakness and foot drop was noticed 3-4 months ago. Activity limitations include sitting, standing and walking due to progressive increase in pain/discomfort with prolonged positioning eventually transitioning into sharp shooting pains in the feet. Pt states by the time she is done grocery shopping (on her feet for ~1 hour), it feels like her left leg is going to give out. Due to this feeling, she has a fear of falling. She does endorse 1 fall last summer in which  she was stepping off a curb and fell. Numbness/pain in feet does disrupt sleep. She also states balance has become impaired with difficulty stepping from one surface to another, especially onto uneven terrain. Patient states she is in the process of being tested for gastroparesis. Denies red flags.   History: DM, HTN Red flags (bowel/bladder changes, saddle paresthesia, personal history of cancer, chills/fever, night sweats, unrelenting pain): Negative Referring Dx: bilateral foot drop Referring Provider: Dr. 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  Hobbies: music  Goals: would like to be able to stand through a concert, ability to be more independent and more confident with walking (typically worried about L leg)   Grocery shopping (~1 hour) feels like leg is going to give out  25-30 minute pain-free standing  L numbness into the thigh after about 30 minutes sitting    Pain Worst: 7/10, Best/current 1/10 Pain description: discomfort, numbness, cold, shooting in the foot mostly at night    OBJECTIVE  Musculoskeletal Tremor: Absent Bulk: Atrophy in left gastroc, especially medial.  Tone: Normal, no clonus   Posture No gross abnormalities noted in standing or seated posture   Gait Wide BOS with bilateral toe out. Decreased foot clearance and push off bilaterally however no LOB.    Strength R/L 4/4 Hip flexion 3+/3 Hip external rotation 3+/3 Hip internal rotation 5/5 Hip extension  5/5 Hip abduction 5/5 Hip adduction 4/4 Knee extension 4-/4- Knee flexion 3/3 Ankle Plantarflexion ??? 4-/3+ Ankle  Dorsiflexion   NEUROLOGICAL:  Sensation Impaired lower leg, bilateral R worse than L. Stocking distribution.  Light touch more impaired than deep pressure.  Hot/cold testing not assessed.      FUNCTIONAL OUTCOME MEASURES  FGA: 20/30  *most difficulty with narrow base, eyes closed and backwards walking    POSTURAL CONTROL TESTS   Modified Clinical Test of Sensory Interaction for Balance    (CTSIB):  CONDITION TIME STRATEGY SWAY  Eyes open, firm surface 30 seconds ankle minimal  Eyes closed, firm surface 30 seconds ankle moderate  Eyes open, foam surface 30 seconds ankle mild  Eyes closed, foam surface 30 seconds Hip, UE support  Significant sway, unable to correct w/o UE assist      ASSESSMENT Clinical Impression: Pt is a pleasant 36 year-old male referred for bilateral foot drop 2/2 diabetic polyneuropathy; evaluation reveals significant balance deficits as well as impairments in strength and sensation. PT examination reveals diminished light touch and deep pressure sensation through a "stocking" pattern; decreased proprioceptive awareness of B feet; weakness in the hips,  quads and ankles; difficulty with standing stability primarily on soft/uneven surfaces and with narrow BOS. Pt will benefit from skilled PT services to address above deficits, improve strength and balance, and improve QOL.    PLAN Next Visit: Strength - Hip F/IR/ER, knee F/E, ankle all planes. Standing stability.  HEP: [id number]    Objective measurements completed on examination: See above findings.    Plan - 06/21/21 1027     Clinical Impression Statement Pt is a pleasant 36 year-old male referred for bilateral foot drop 2/2 diabetic polyneuropathy; evaluation reveals significant balance deficits as well as impairments in strength and sensation. PT examination reveals diminished light touch and deep pressure sensation through a "stocking" pattern; decreased proprioceptive awareness of B feet;  weakness in the hips, quads and ankles; difficulty with standing stability primarily on soft/uneven surfaces and with narrow BOS. Pt will benefit from skilled PT services to address above deficits, improve strength and balance, and improve QOL.    Personal Factors and Comorbidities Comorbidity 1;Fitness;Time since onset of injury/illness/exacerbation    Comorbidities DM    Examination-Activity Limitations Lift;Sit;Stand;Sleep;Locomotion Level;Stairs    Examination-Participation Restrictions Yard Work;Cleaning;Community Activity    Stability/Clinical Decision Making Evolving/Moderate complexity    Clinical Decision Making Low    Rehab Potential Fair    PT Frequency 2x / week    PT Duration 8 weeks    PT Treatment/Interventions Aquatic Therapy;ADLs/Self Care Home Management;DME Instruction;Gait training;Stair training;Functional mobility training;Therapeutic activities;Therapeutic exercise;Balance training;Neuromuscular re-education;Patient/family education;Orthotic Fit/Training;Manual techniques;Dry needling;Passive range of motion;Energy conservation;Joint Manipulations    PT Next Visit Plan Strength - Hip F/IR/ER, knee F/E, ankle all planes. Standing stability on various surfaces to challenge somatosensory system.    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, [place], [address] Phone: [phone]   Fax:  [phone]  Name: Carl [name] MRN: [id number] Date of Birth: 06-22-1985

## 2021-06-23 ENCOUNTER — Ambulatory Visit: Payer: BC Managed Care – PPO | Admitting: Physical Therapy

## 2021-06-27 ENCOUNTER — Ambulatory Visit: Payer: BC Managed Care – PPO | Admitting: Family Medicine

## 2021-06-28 ENCOUNTER — Encounter: Payer: BC Managed Care – PPO | Admitting: Physical Therapy

## 2021-06-30 ENCOUNTER — Ambulatory Visit: Payer: BC Managed Care – PPO | Admitting: Physical Therapy

## 2021-07-05 ENCOUNTER — Ambulatory Visit: Payer: BC Managed Care – PPO | Admitting: Physical Therapy

## 2021-07-07 ENCOUNTER — Encounter: Payer: Self-pay | Admitting: Gastroenterology

## 2021-07-07 ENCOUNTER — Ambulatory Visit: Payer: BC Managed Care – PPO | Admitting: Physical Therapy

## 2021-07-11 ENCOUNTER — Encounter: Payer: Self-pay | Admitting: Emergency Medicine

## 2021-07-11 ENCOUNTER — Other Ambulatory Visit: Payer: Self-pay

## 2021-07-11 ENCOUNTER — Emergency Department
Admission: EM | Admit: 2021-07-11 | Discharge: 2021-07-11 | Disposition: A | Discharge status: Home / Self Care | Payer: BC Managed Care – PPO | Attending: Student in an Organized Health Care Education/Training Program | Admitting: Student in an Organized Health Care Education/Training Program

## 2021-07-11 ENCOUNTER — Emergency Department: Payer: BC Managed Care – PPO

## 2021-07-11 DIAGNOSIS — Z20822 Contact with and (suspected) exposure to covid-19: Secondary | ICD-10-CM | POA: Insufficient documentation

## 2021-07-11 DIAGNOSIS — Z7984 Long term (current) use of oral hypoglycemic drugs: Secondary | ICD-10-CM | POA: Insufficient documentation

## 2021-07-11 DIAGNOSIS — Z794 Long term (current) use of insulin: Secondary | ICD-10-CM | POA: Insufficient documentation

## 2021-07-11 DIAGNOSIS — R112 Nausea with vomiting, unspecified: Secondary | ICD-10-CM | POA: Insufficient documentation

## 2021-07-11 DIAGNOSIS — R6883 Chills (without fever): Secondary | ICD-10-CM | POA: Insufficient documentation

## 2021-07-11 DIAGNOSIS — E119 Type 2 diabetes mellitus without complications: Secondary | ICD-10-CM | POA: Diagnosis not present

## 2021-07-11 LAB — URINALYSIS, ROUTINE W REFLEX MICROSCOPIC
Bacteria, UA: NONE SEEN
Bilirubin Urine: NEGATIVE
Glucose, UA: >=500 mg/dL — AB
Hgb urine dipstick: NEGATIVE
Ketones, ur: 5 mg/dL — AB
Leukocytes,Ua: NEGATIVE
Nitrite: NEGATIVE
Protein, ur: NEGATIVE mg/dL
Specific Gravity, Urine: 1.029 (ref 1.005–1.030)
pH: 5 (ref 5.0–8.0)

## 2021-07-11 LAB — RESP PANEL BY RT-PCR (FLU A&B, COVID) ARPGX2
Influenza A by PCR: NEGATIVE
Influenza B by PCR: NEGATIVE
SARS Coronavirus 2 by RT PCR: NEGATIVE

## 2021-07-11 LAB — COMPREHENSIVE METABOLIC PANEL
ALT: 55 U/L — ABNORMAL HIGH (ref 0–44)
AST: 40 U/L (ref 15–41)
Albumin: 4.2 g/dL (ref 3.5–5.0)
Alkaline Phosphatase: 103 U/L (ref 38–126)
Anion gap: 9 (ref 5–15)
BUN: 13 mg/dL (ref 6–20)
CO2: 24 mmol/L (ref 22–32)
Calcium: 9.4 mg/dL (ref 8.9–10.3)
Chloride: 108 mmol/L (ref 98–111)
Creatinine, Ser: 0.74 mg/dL (ref 0.61–1.24)
GFR, Estimated: >60 mL/min (ref >60)
Glucose, Bld: 162 mg/dL — ABNORMAL HIGH (ref 70–99)
Potassium: 3.7 mmol/L (ref 3.5–5.1)
Sodium: 141 mmol/L (ref 135–145)
Total Bilirubin: 0.8 mg/dL (ref 0.3–1.2)
Total Protein: 7 g/dL (ref 6.5–8.1)

## 2021-07-11 LAB — CBC
HCT: 42.5 % (ref 39.0–52.0)
Hemoglobin: 14.8 g/dL (ref 13.0–17.0)
MCH: 29 pg (ref 26.0–34.0)
MCHC: 34.8 g/dL (ref 30.0–36.0)
MCV: 83.3 fL (ref 80.0–100.0)
Platelets: 324 10*3/uL (ref 150–400)
RBC: 5.1 MIL/uL (ref 4.22–5.81)
RDW: 12.7 % (ref 11.5–15.5)
WBC: 16.8 10*3/uL — ABNORMAL HIGH (ref 4.0–10.5)
nRBC: 0 % (ref 0.0–0.2)

## 2021-07-11 LAB — LIPASE, BLOOD: Lipase: 28 U/L (ref 11–51)

## 2021-07-11 MED ORDER — ONDANSETRON HCL 4 MG/2ML IJ SOLN
4.0000 mg | Freq: Once | INTRAMUSCULAR | Status: AC
Start: 2021-07-11 — End: 2021-07-11
  Administered 2021-07-11: 4 mg via INTRAVENOUS
  Filled 2021-07-11: qty 2

## 2021-07-11 MED ORDER — DROPERIDOL 2.5 MG/ML IJ SOLN
2.5000 mg | Freq: Once | INTRAMUSCULAR | Status: AC
  Administered 2021-07-11: 2.5 mg via INTRAVENOUS
  Filled 2021-07-11: qty 2

## 2021-07-11 MED ORDER — LACTATED RINGERS IV BOLUS
1000.0000 mL | Freq: Once | INTRAVENOUS | Status: AC
Start: 2021-07-11 — End: 2021-07-11
  Administered 2021-07-11: 1000 mL via INTRAVENOUS

## 2021-07-11 MED ORDER — SODIUM CHLORIDE 0.9 % IV BOLUS
1000.0000 mL | Freq: Once | INTRAVENOUS | Status: AC
  Administered 2021-07-11: 1000 mL via INTRAVENOUS

## 2021-07-11 MED ORDER — PROMETHAZINE HCL 12.5 MG PO TABS: 12.5000 mg | ORAL_TABLET | Freq: Four times a day (QID) | ORAL | 0 refills | Status: DC | PRN

## 2021-07-11 NOTE — ED Notes (Signed)
Pt to xray

## 2021-07-11 NOTE — ED Notes (Signed)
Pt to ED for abrupt onset of vomiting since this morning, has vomited 6 times today, denies fevers diarrhea and pain. Pt ambulatory to room.  ?

## 2021-07-11 NOTE — ED Provider Notes (Signed)
? ?[facility] ?Provider Note ? ? ? Event Date/Time  ? First MD Initiated Contact with Patient 07/11/21 [phone]   ?  (approximate) ? ? ?History  ? ?Emesis ? ? ?HPI ? ?Carl [name] is a 36 y.o. adult with a history of diabetes on metformin and insulin presents to the ER for evaluation of nausea vomiting multiple times since early this morning around 2 AM.  Denying any abdominal pain.  Denies any chest pain shortness of breath.  Did have some chills.  Denies any cough or congestion no back pain.  No diarrhea.  No dysuria.  Ate Ramen noodles for dinner last night.  Is worried this may be gastroparesis. ?  ? ? ?Physical Exam  ? ?Triage Vital Signs: ?ED Triage Vitals [07/11/21 0824]  ?Enc Vitals Group  ?   BP 117/71  ?   Pulse Rate (!) 118  ?   Resp 16  ?   Temp 99.1 ?F (37.3 ?C)  ?   Temp Source Oral  ?   SpO2 95 %  ?   Weight 225 lb (102.1 kg)  ?   Height 5\' 10"  (1.778 m)  ?   Head Circumference   ?   Peak Flow   ?   Pain Score 5  ?   Pain Loc   ?   Pain Edu?   ?   Excl. in GC?   ? ? ?Most recent vital signs: ?Vitals:  ? 07/11/21 1230 07/11/21 1300  ?BP: 122/78 135/75  ?Pulse: 100 96  ?Resp: (!) 4 (!) 22  ?Temp:    ?SpO2: 97% 96%  ? ? ? ?Constitutional: Alert  ?Eyes: Conjunctivae are normal.  ?Head: Atraumatic. ?Nose: No congestion/rhinnorhea. ?Mouth/Throat: Mucous membranes are moist.   ?Neck: Painless ROM.  ?Cardiovascular:   Good peripheral circulation. ?Respiratory: Normal respiratory effort.  No retractions.  ?Gastrointestinal: Soft and nontender in all four quadrants, no guarding or rebound ?Musculoskeletal:  no deformity ?Neurologic:  MAE spontaneously. No gross focal neurologic deficits are appreciated.  ?Skin:  Skin is warm, dry and intact. No rash noted. ?Psychiatric: Mood and affect are normal. Speech and behavior are normal. ? ? ? ?ED Results / Procedures / Treatments  ? ?Labs ?(all labs ordered are listed, but only abnormal results are displayed) ?Labs Reviewed  ?COMPREHENSIVE  METABOLIC PANEL - Abnormal; Notable for the following components:  ?    Result Value  ? Glucose, Bld 162 (*)   ? ALT 55 (*)   ? All other components within normal limits  ?CBC - Abnormal; Notable for the following components:  ? WBC 16.8 (*)   ? All other components within normal limits  ?URINALYSIS, ROUTINE W REFLEX MICROSCOPIC - Abnormal; Notable for the following components:  ? Color, Urine YELLOW (*)   ? APPearance CLEAR (*)   ? Glucose, UA >=500 (*)   ? Ketones, ur 5 (*)   ? All other components within normal limits  ?RESP PANEL BY RT-PCR (FLU A&B, COVID) [id number]  ?LIPASE, BLOOD  ? ? ? ? ?RADIOLOGY ?Please see ED Course for my review and interpretation. ? ?I personally reviewed all radiographic images ordered to evaluate for the above acute complaints and reviewed radiology reports and findings.  These findings were personally discussed with the patient.  Please see medical record for radiology report. ? ? ? ?PROCEDURES: ? ?Critical Care performed:  ? ?Procedures ? ? ?MEDICATIONS ORDERED IN ED: ?Medications  ?sodium chloride 0.9 % bolus 1,000 mL (0  mLs Intravenous Stopped 07/11/21 1301)  ?[facility]) injection 4 mg (4 mg Intravenous Given 07/11/21 0906)  ?droperidol (INAPSINE) 2.5 MG/ML injection 2.5 mg (2.5 mg Intravenous Given 07/11/21 1044)  ?lactated ringers bolus 1,000 mL (0 mLs Intravenous Stopped 07/11/21 1301)  ? ? ? ?IMPRESSION / MDM / ASSESSMENT AND PLAN / ED COURSE  ?I reviewed the triage vital signs and the nursing notes. ?             ?               ? ?Differential diagnosis includes, but is not limited to, enteritis gastritis, SBO, viral illness, foodborne illness, colitis, sepsis, dehydration, gastroparesis, DKA ? ?Patient presenting with nausea vomiting over the past 12 hours as described above.  Abdominal exam soft and benign appears well perfused no acute distress.  Do not feel this is consistent with acute appendicitis SBO perforation or diverticulitis.  Will give IV fluids as well as IV  antiemetic. ? ?Clinical Course as of 07/11/21 1308  ?Mon Jul 11, 2021  ?5621 My review of chest x-ray no evidence of infiltrate or cardiomegaly.  Blood work with no AKI or acidemia. [PR]  ?1031 Patient reassessed.  Repeat abdominal exam soft and benign.  Still feeling nauseated.  Will give droperidol. [PR]  ?[id number] Patient feels very well at this time.  No abdominal pain.  Tolerating p.o.  Did believe patient stable and appropriate for outpatient follow-up. [PR]  ?  ?Clinical Course User Index ?[PR] Willy Eddy, MD  ? ? ? ?FINAL CLINICAL IMPRESSION(S) / ED DIAGNOSES  ? ?Final diagnoses:  ?Nausea and vomiting, unspecified vomiting type  ? ? ? ?Rx / DC Orders  ? ?ED Discharge Orders   ? ? None  ? ?  ? ? ? ?Note:  This document was prepared using Dragon voice recognition software and may include unintentional dictation errors. ? ?  ?Willy Eddy, MD ?07/11/21 1308 ? ?

## 2021-07-11 NOTE — ED Notes (Signed)
Pt given urine cup and educated on the need for urine sample. Pt unable to provide sample at this time.  ?

## 2021-07-11 NOTE — ED Triage Notes (Signed)
Pt to ED via POV stating that since about 0200 he has been having emesis. Pt states that he has vomited about 6 times. Pt reports that his PCP thinks that he has gastroparesis. Pt is currently in NAD.  ?

## 2021-07-11 NOTE — ED Notes (Signed)
Offered PO fluids and crackers. Pt states still feels very nauseous. Will try again soon. IVF still infusing. ?

## 2021-07-11 NOTE — ED Notes (Signed)
Pt stats feels much better and nausea has resolved after droperidol. ?

## 2021-07-12 ENCOUNTER — Encounter: Payer: Self-pay | Admitting: Family Medicine

## 2021-07-14 ENCOUNTER — Encounter: Payer: BC Managed Care – PPO | Admitting: Physical Therapy

## 2021-07-19 ENCOUNTER — Encounter: Payer: BC Managed Care – PPO | Admitting: Physical Therapy

## 2021-07-21 ENCOUNTER — Encounter: Payer: BC Managed Care – PPO | Admitting: Physical Therapy

## 2021-07-22 ENCOUNTER — Other Ambulatory Visit: Payer: Self-pay | Admitting: *Deleted

## 2021-07-22 MED ORDER — METFORMIN HCL ER 750 MG PO TB24: 750.0000 mg | ORAL_TABLET | Freq: Two times a day (BID) | ORAL | 3 refills | Status: DC

## 2021-08-01 ENCOUNTER — Ambulatory Visit: Payer: BC Managed Care – PPO | Admitting: Gastroenterology

## 2021-08-01 ENCOUNTER — Encounter: Payer: Self-pay | Admitting: Gastroenterology

## 2021-08-01 VITALS — BP 142/82 | HR 65 | Ht 70.0 in | Wt 228.4 lb

## 2021-08-01 DIAGNOSIS — K5909 Other constipation: Secondary | ICD-10-CM

## 2021-08-01 DIAGNOSIS — K3184 Gastroparesis: Secondary | ICD-10-CM | POA: Diagnosis not present

## 2021-08-01 DIAGNOSIS — R112 Nausea with vomiting, unspecified: Secondary | ICD-10-CM | POA: Diagnosis not present

## 2021-08-01 MED ORDER — MOTEGRITY 2 MG PO TABS: 2.0000 mg | ORAL_TABLET | Freq: Every day | ORAL | 11 refills | Status: DC

## 2021-08-01 NOTE — Patient Instructions (Addendum)
It was my pleasure to provide care to you today. Based on our discussion, I am providing you with my recommendations below: ? ?RECOMMENDATION(S):  ? ?PRESCRIPTION MEDICATION(S):  ? ?We have sent the following medication(s) to your pharmacy: ? ?Motegrity 2 mg daily.  ? ?NOTE: If your medication(s) requires a PRIOR AUTHORIZATION, we will receive notification from your pharmacy. Once received, the process to submit for approval may take up to 7-10 business days. You will be contacted about any denials we have received from your insurance company as well as alternatives recommended by your provider. ? ?ENDOSCOPY:  ? ?You have been scheduled for an endoscopy. Please follow written instructions given to you at your visit today. ? ?INHALERS:  ? ?If you use inhalers (even only as needed), please bring them with you on the day of your procedure. ? ?FOLLOW UP: ? ?After your procedure, you will receive a call from my office staff regarding my recommendation for follow up. ? ?BMI: ? ?If you are age 38 or younger, your body mass index should be between 19-25. Your Body mass index is 32.77 kg/m?Marland Kitchen If this is out of the aformentioned range listed, please consider follow up with your Primary Care Provider.  ? ?MY CHART: ? ?The [place] [facility] providers would like to encourage you to use [facility] to communicate with providers for non-urgent requests or questions.  Due to long hold times on the telephone, sending your provider a message by [facility] may be a faster and more efficient way to get a response.  Please allow 48 business hours for a response.  Please remember that this is for non-urgent requests.  ? ?Thank you for trusting me with your gastrointestinal care!   ? ?Thornton Park, MD, MPH ? ?

## 2021-08-01 NOTE — Progress Notes (Signed)
? ?Referring Provider: Westley Chandler, MD ?Primary Care Physician:  Westley Chandler, MD ? ?Reason for Consultation: Nausea, vomiting, epigastric pain ? ? ?IMPRESSION:  ?One year of nausea, vomiting, epigastric pain not explained by abdominal ultrasound. Liver enzymes and lipase are normal. Suspected gastroparesis given her concurrent diabetes.  Other etiologies must be considered.  Endoscopic evaluation recommended including gastric biopsies for H pylori and duodenal biopsies for celiac. ? ?Constipation. This is severe enough that it may be the cause of her other GI symptoms. I have recommended Motegrity for constipation, knowing that it can also be helpful with gastroparesis. Hopefully, her insurance with agree.  ? ? ?PLAN: ?- Empiric trial of Motegrity 2 mg daily ?- Avoid all NSAIDs ?- Upper endoscopy on Monday for ease of scheduling for Carl [name] ?- Gastric emptying scan if EGD is negative ? ? ?HPI: Carl [name] is a 36 y.o. adult referred by Dr. Manson Passey for further evaluation of nausea, vomiting, and epigastric pain.  The history is obtained through the patient and review of her electronic health record.  She works as an Neurosurgeon.  ? ?She has a history of type 2 diabetes that was previously poorly controlled but now under good control who has been experiencing intermittent nausea, vomiting, and epigastric pain after meals over the last year.  Symptoms have become progressively more frequent.  Even required ER visit earlier this month.  Rare heartburn with ongoing globus. Prior response to Nexium. No associated abdominal pain.  No identified food triggers.  No odynophagia or odynophagia.  Since being off Mounjaro, has nausea daily without vomiting. ? ?Baseline bowel habits have not changed and she has a stool once or twice a week.  Once she went 10 days between bowel movements. There is associated bloating and a sense of incomplete evacuation and she has to strain.  She uses Miralax QD-BID  PRN.  No blood or mucus in the stool, although she feels her stools are dark.   ? ?The nausea, vomiting, and epigastric pain worsen as the constipation worsens.  ? ?There is been minimal improvement with empiric Reglan. She stopped to avoid long term treatments.    ?Zofran provides some symptomatic relief. But, she stopped using it due to the potential side effect of constipation.  ?Not using any NSAIDs. ? ?Change to a low carb diet did not provide any relief.  ? ?No prior endoscopic evaluation. ? ?Labs 07/11/2021 showed normal lipase, normal liver enzymes, normal hemoglobin, normal platelets ?Abdominal ultrasound 06/07/2021 showed no acute abnormalities ? ?Mother with significant gastroparesis due to diabetes. She is hopeful it's not gastroparesis after watching her mother. There is a family history of gastroparesis.  There is no known family history of colon cancer or polyps. No family history of stomach cancer or other GI malignancy. No family history of inflammatory bowel disease or celiac.  ? ? ?Past Medical History:  ?Diagnosis Date  ? Diabetes mellitus without complication (HCC)   ? Dyslipidemia   ? Hypertension   ? Neuropathy   ? ? ?Past Surgical History:  ?Procedure Laterality Date  ? MOUTH SURGERY    ? WISDOM TOOTH EXTRACTION    ? ? ? ?Current Outpatient Medications  ?Medication Sig Dispense Refill  ? albuterol (VENTOLIN HFA) 108 (90 Base) MCG/ACT inhaler Inhale 2 puffs into the lungs every 6 (six) hours as needed for wheezing or shortness of breath. 18 g 3  ? amLODipine (NORVASC) 5 MG tablet Take 1 tablet (5 mg total) by mouth at  bedtime. 90 tablet 3  ? atorvastatin (LIPITOR) 20 MG tablet Take 1 tablet (20 mg total) by mouth at bedtime. 90 tablet 3  ? cetirizine (ZYRTEC) 10 MG tablet Take 10 mg by mouth daily.    ? Continuous Blood Gluc Sensor (FREESTYLE LIBRE 3 SENSOR) MISC 1 application by Does not apply route as directed. Apply one sensor every 14 days 2 each 2  ? dapagliflozin propanediol (FARXIGA) 10 MG  TABS tablet Take 1 tablet (10 mg total) by mouth daily before breakfast. 30 tablet 2  ? emtricitabine-tenofovir (TRUVADA) 200-300 MG tablet Take 1 tablet by mouth daily. 90 tablet 0  ? fluticasone (FLONASE) 50 MCG/ACT nasal spray Place 2 sprays into both nostrils daily. (Patient taking differently: Place 2 sprays into both nostrils as needed.) 16 g 0  ? ibuprofen (ADVIL) 800 MG tablet Take 200 mg by mouth every 8 (eight) hours as needed.    ? insulin glargine (LANTUS) 100 UNIT/ML Solostar Pen Inject 14 Units into the skin daily. (Patient taking differently: Inject 26 Units into the skin daily.) 15 mL 1  ? Insulin Pen Needle (NOVOFINE) 30G X 8 MM MISC Use to inject insulin daily 100 each 11  ? losartan (COZAAR) 50 MG tablet Take 1 tablet (50 mg total) by mouth at bedtime. 90 tablet 3  ? metFORMIN (GLUCOPHAGE-XR) 750 MG 24 hr tablet Take 1 tablet (750 mg total) by mouth 2 (two) times daily with a meal. 90 tablet 3  ? Multiple Vitamins-Minerals (MULTIVITAMIN WITH MINERALS) tablet Take 1 tablet by mouth daily.    ? polyethylene glycol powder (GLYCOLAX/MIRALAX) 17 GM/SCOOP powder Take 17 g by mouth 2 (two) times daily as needed. 3350 g 1  ? Prucalopride Succinate (MOTEGRITY) 2 MG TABS Take 1 tablet (2 mg total) by mouth daily. 30 tablet 11  ? ?No current facility-administered medications for this visit.  ? ? ?Allergies as of 08/01/2021 - Review Complete 08/01/2021  ?Allergen Reaction Noted  ? Lisinopril Cough 12/13/2020  ? Metformin and related Diarrhea 12/13/2020  ? ? ?Family History  ?Problem Relation Age of Onset  ? Diabetes Mother   ?     died of complicatiosn of renal disease, DM   ? Diabetes Father   ? Healthy Sister   ? Diabetes Maternal Grandmother   ? Diabetes Maternal Grandfather   ? Diabetes Paternal Grandmother   ? Breast cancer Paternal Grandmother   ? Diabetes Paternal Grandfather   ? Colon cancer Neg Hx   ? Liver disease Neg Hx   ? Pancreatic cancer Neg Hx   ? ? ?Social History  ? ?Socioeconomic History  ?  Marital status: Single  ?  Spouse name: Not on file  ? Number of children: 0  ? Years of education: Not on file  ? Highest education level: Bachelor's degree (e.g., BA, AB, BS)  ?Occupational History  ? Not on file  ?Tobacco Use  ? Smoking status: Never  ?  Passive exposure: Never  ? Smokeless tobacco: Never  ?Vaping Use  ? Vaping Use: Former  ? Substances: CBD  ? Devices: Used with CBD  ?Substance and Sexual Activity  ? Alcohol use: Yes  ?  Comment: rarely  ? Drug use: No  ? Sexual activity: Yes  ?  Partners: Male  ?  Birth control/protection: None  ?Other Topics Concern  ? Not on file  ?Social History Narrative  ? Works for [employer]   ? Degree in Music- [employer]   ? Has a dog- part  [place]   ?   ? The patient prefers the name [name].  She reports this preference is actually from the Bible as this is Pernell Dupredams first wife in addition she does identify somewhat with the weekend culture and really likes this name.  She has been Wellsite geologistLilith since college. Identifies as Carl [name].   ? ?Social Determinants of Health  ? ?Financial Resource Strain: Not on file  ?Food Insecurity: Not on file  ?Transportation Needs: Not on file  ?Physical Activity: Not on file  ?Stress: Not on file  ?Social Connections: Not on file  ?Intimate Partner Violence: Not on file  ? ? ?Review of Systems: ?12 system ROS is negative except as noted above with the addition of anxiety, depression, fatigue, shortness of breath.  ? ?Physical Exam: ?General:   Alert,  well-nourished, pleasant and cooperative in NAD ?Head:  Normocephalic and atraumatic. ?Eyes:  Sclera clear, no icterus.   Conjunctiva pink. ?Ears:  Normal auditory acuity. ?Nose:  No deformity, discharge,  or lesions. ?Mouth:  No deformity or lesions.   ?Neck:  Supple; no masses or thyromegaly. ?Lungs:  Clear throughout to auscultation.   No wheezes. ?Heart:  Regular rate and rhythm; no murmurs. ?Abdomen:  Soft, nontender, nondistended, normal bowel sounds, no rebound or guarding. No  hepatosplenomegaly.   ?Rectal:  Deferred  ?Msk:  Symmetrical. No boney deformities ?LAD: No inguinal or umbilical LAD ?Extremities:  No clubbing or edema. ?Neurologic:  Alert and  oriented x4;  grossly nonfocal ?Skin:

## 2021-08-08 ENCOUNTER — Telehealth: Payer: BC Managed Care – PPO | Admitting: Physician Assistant

## 2021-08-08 DIAGNOSIS — J4531 Mild persistent asthma with (acute) exacerbation: Secondary | ICD-10-CM | POA: Diagnosis not present

## 2021-08-08 MED ORDER — BENZONATATE 100 MG PO CAPS: 100.0000 mg | ORAL_CAPSULE | Freq: Three times a day (TID) | ORAL | 0 refills | Status: DC | PRN

## 2021-08-08 MED ORDER — AZITHROMYCIN 250 MG PO TABS: ORAL_TABLET | ORAL | 0 refills | Status: AC

## 2021-08-08 MED ORDER — FLUTICASONE PROPIONATE HFA 110 MCG/ACT IN AERO: 1.0000 | INHALATION_SPRAY | Freq: Two times a day (BID) | RESPIRATORY_TRACT | 0 refills | Status: DC

## 2021-08-08 NOTE — Progress Notes (Signed)
?Virtual Visit Consent  ? ?Wynn Banker, you are scheduled for a virtual visit with a [facility] provider today.   ?  ?Just as with appointments in the office, your consent must be obtained to participate.  Your consent will be active for this visit and any virtual visit you may have with one of our providers in the next 365 days.   ?  ?If you have a MyChart account, a copy of this consent can be sent to you electronically.  All virtual visits are billed to your insurance company just like a traditional visit in the office.   ? ?As this is a virtual visit, video technology does not allow for your provider to perform a traditional examination.  This may limit your provider's ability to fully assess your condition.  If your provider identifies any concerns that need to be evaluated in person or the need to arrange testing (such as labs, EKG, etc.), we will make arrangements to do so.   ?  ?Although advances in technology are sophisticated, we cannot ensure that it will always work on either your end or our end.  If the connection with a video visit is poor, the visit may have to be switched to a telephone visit.  With either a video or telephone visit, we are not always able to ensure that we have a secure connection.    ? ?I need to obtain your verbal consent now.   Are you willing to proceed with your visit today?  ?  ?Carl [name] has provided verbal consent on 08/08/2021 for a virtual visit (video or telephone). ?  ?Mar Daring, PA-C  ? ?Date: 08/08/2021 8:29 AM ? ? ?Virtual Visit via Video Note  ? ?IMar Daring, connected with  Carl [name]  ([id number], 03-28-1986) on 08/08/21 at  8:15 AM EDT by a video-enabled telemedicine application and verified that I am speaking with the correct person using two identifiers. ? ?Location: ?Patient: Virtual Visit Location Patient: Home ?Provider: Virtual Visit Location Provider: Home Office ?  ?I discussed the limitations of evaluation and management  by telemedicine and the availability of in person appointments. The patient expressed understanding and agreed to proceed.   ? ?History of Present Illness: ?Carl [name] is a 36 y.o. who identifies as a male who was assigned adult at birth, and is being seen today for URI symptoms. ? ?HPI: URI  ?This is a new problem. The current episode started in the past 7 days (Friday). The problem has been gradually worsening. Maximum temperature: 99.1. Associated symptoms include chest pain (tightness; having to use inhaler more), congestion, coughing, headaches, rhinorrhea (and post nasal drainage), sinus pain and a sore throat. Pertinent negatives include no diarrhea, ear pain, nausea, plugged ear sensation, vomiting or wheezing. She has tried inhaler use for the symptoms. The treatment provided no relief.   ? ? ?Problems:  ?Patient Active Problem List  ? Diagnosis Date Noted  ? 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 PREP Therapy  02/04/2021  ? Impaired range of motion of left shoulder 02/04/2021  ? Intermittent asthma 12/21/2020  ? On pre-exposure prophylaxis for HIV 11/07/2018  ? Skin lesion 02/15/2018  ? 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 Dyslipidemia 02/15/2018  ? Diabetes ([place]) 01/14/2018  ? Essential hypertension 01/14/2018  ? Prefers Male Pronouns--- Carl [name]  01/14/2018  ?  ?Allergies:  ?Allergies  ?Allergen Reactions  ? Lisinopril Cough  ? Metformin And Related Diarrhea  ?  Has tried multiple times  without success   ? ?Medications:  ?Current Outpatient Medications:  ?  azithromycin (ZITHROMAX) 250 MG tablet, Take 2 tablets on day 1, then 1 tablet daily on days 2 through 5, Disp: 6 tablet, Rfl: 0 ?  benzonatate (TESSALON) 100 MG capsule, Take 1 capsule (100 mg total) by mouth 3 (three) times daily as needed., Disp: 30 capsule, Rfl: 0 ?  fluticasone (FLOVENT HFA) 110 MCG/ACT inhaler, Inhale 1 puff into the lungs in the morning and at bedtime., Disp: 1 each, Rfl: 0 ?  albuterol (VENTOLIN HFA) 108 (90 Base) MCG/ACT inhaler,  Inhale 2 puffs into the lungs every 6 (six) hours as needed for wheezing or shortness of breath., Disp: 18 g, Rfl: 3 ?  amLODipine (NORVASC) 5 MG tablet, Take 1 tablet (5 mg total) by mouth at bedtime., Disp: 90 tablet, Rfl: 3 ?  atorvastatin (LIPITOR) 20 MG tablet, Take 1 tablet (20 mg total) by mouth at bedtime., Disp: 90 tablet, Rfl: 3 ?  cetirizine (ZYRTEC) 10 MG tablet, Take 10 mg by mouth daily., Disp: , Rfl:  ?  Continuous Blood Gluc Sensor (FREESTYLE LIBRE 3 SENSOR) MISC, 1 application by Does not apply route as directed. Apply one sensor every 14 days, Disp: 2 each, Rfl: 2 ?  dapagliflozin propanediol (FARXIGA) 10 MG TABS tablet, Take 1 tablet (10 mg total) by mouth daily before breakfast., Disp: 30 tablet, Rfl: 2 ?  emtricitabine-tenofovir (TRUVADA) 200-300 MG tablet, Take 1 tablet by mouth daily., Disp: 90 tablet, Rfl: 0 ?  fluticasone (FLONASE) 50 MCG/ACT nasal spray, Place 2 sprays into both nostrils daily. (Patient taking differently: Place 2 sprays into both nostrils as needed.), Disp: 16 g, Rfl: 0 ?  ibuprofen (ADVIL) 800 MG tablet, Take 200 mg by mouth every 8 (eight) hours as needed., Disp: , Rfl:  ?  insulin glargine (LANTUS) 100 UNIT/ML Solostar Pen, Inject 14 Units into the skin daily. (Patient taking differently: Inject 26 Units into the skin daily.), Disp: 15 mL, Rfl: 1 ?  Insulin Pen Needle (NOVOFINE) 30G X 8 MM MISC, Use to inject insulin daily, Disp: 100 each, Rfl: 11 ?  losartan (COZAAR) 50 MG tablet, Take 1 tablet (50 mg total) by mouth at bedtime., Disp: 90 tablet, Rfl: 3 ?  metFORMIN (GLUCOPHAGE-XR) 750 MG 24 hr tablet, Take 1 tablet (750 mg total) by mouth 2 (two) times daily with a meal., Disp: 90 tablet, Rfl: 3 ?  Multiple Vitamins-Minerals (MULTIVITAMIN WITH MINERALS) tablet, Take 1 tablet by mouth daily., Disp: , Rfl:  ?  polyethylene glycol powder (GLYCOLAX/MIRALAX) 17 GM/SCOOP powder, Take 17 g by mouth 2 (two) times daily as needed., Disp: 3350 g, Rfl: 1 ?  Prucalopride  Succinate (MOTEGRITY) 2 MG TABS, Take 1 tablet (2 mg total) by mouth daily., Disp: 30 tablet, Rfl: 11 ? ?Observations/Objective: ?Patient is well-developed, well-nourished in no acute distress.  ?Resting comfortably at home.  ?Head is normocephalic, atraumatic.  ?No labored breathing.  ?Speech is clear and coherent with logical content.  ?Patient is alert and oriented at baseline.  ? ? ?Assessment and Plan: ?1. Mild persistent asthma with exacerbation ?- azithromycin (ZITHROMAX) 250 MG tablet; Take 2 tablets on day 1, then 1 tablet daily on days 2 through 5  Dispense: 6 tablet; Refill: 0 ?- benzonatate (TESSALON) 100 MG capsule; Take 1 capsule (100 mg total) by mouth 3 (three) times daily as needed.  Dispense: 30 capsule; Refill: 0 ?- fluticasone (FLOVENT HFA) 110 MCG/ACT inhaler; Inhale 1 puff into the lungs in  the morning and at bedtime.  Dispense: 1 each; Refill: 0 ? ?- Suspect asthma exacerbation/reactive airway due to seasonal allergy irritations ?- Zpack for high risk for bacterial infection ?- Add flovent, continue Albuterol ?- Tessalon perles for cough ?- Push fluids ?- Tylenol and Ibuprofen for fevers and body aches ?- Push fluids ?- Seek in person evaluation if not improving or if symptoms worsen ? ?Follow Up Instructions: ?I discussed the assessment and treatment plan with the patient. The patient was provided an opportunity to ask questions and all were answered. The patient agreed with the plan and demonstrated an understanding of the instructions.  A copy of instructions were sent to the patient via MyChart unless otherwise noted below.  ? ? ?The patient was advised to call back or seek an in-person evaluation if the symptoms worsen or if the condition fails to improve as anticipated. ? ?Time:  ?I spent 12 minutes with the patient via telehealth technology discussing the above problems/concerns.   ? ?Mar Daring, PA-C ?

## 2021-08-08 NOTE — Patient Instructions (Signed)
?Carl [name], thank you for joining Margaretann Loveless, PA-C for today's virtual visit.  While this provider is not your primary care provider (PCP), if your PCP is located in our provider database this encounter information will be shared with them immediately following your visit. ? ?Consent: ?(Patient) Carl [name] provided verbal consent for this virtual visit at the beginning of the encounter. ? ?Current Medications: ? ?Current Outpatient Medications:  ?  azithromycin (ZITHROMAX) 250 MG tablet, Take 2 tablets on day 1, then 1 tablet daily on days 2 through 5, Disp: 6 tablet, Rfl: 0 ?  benzonatate (TESSALON) 100 MG capsule, Take 1 capsule (100 mg total) by mouth 3 (three) times daily as needed., Disp: 30 capsule, Rfl: 0 ?  fluticasone (FLOVENT HFA) 110 MCG/ACT inhaler, Inhale 1 puff into the lungs in the morning and at bedtime., Disp: 1 each, Rfl: 0 ?  albuterol (VENTOLIN HFA) 108 (90 Base) MCG/ACT inhaler, Inhale 2 puffs into the lungs every 6 (six) hours as needed for wheezing or shortness of breath., Disp: 18 g, Rfl: 3 ?  amLODipine (NORVASC) 5 MG tablet, Take 1 tablet (5 mg total) by mouth at bedtime., Disp: 90 tablet, Rfl: 3 ?  atorvastatin (LIPITOR) 20 MG tablet, Take 1 tablet (20 mg total) by mouth at bedtime., Disp: 90 tablet, Rfl: 3 ?  cetirizine (ZYRTEC) 10 MG tablet, Take 10 mg by mouth daily., Disp: , Rfl:  ?  Continuous Blood Gluc Sensor (FREESTYLE LIBRE 3 SENSOR) MISC, 1 application by Does not apply route as directed. Apply one sensor every 14 days, Disp: 2 each, Rfl: 2 ?  dapagliflozin propanediol (FARXIGA) 10 MG TABS tablet, Take 1 tablet (10 mg total) by mouth daily before breakfast., Disp: 30 tablet, Rfl: 2 ?  emtricitabine-tenofovir (TRUVADA) 200-300 MG tablet, Take 1 tablet by mouth daily., Disp: 90 tablet, Rfl: 0 ?  fluticasone (FLONASE) 50 MCG/ACT nasal spray, Place 2 sprays into both nostrils daily. (Patient taking differently: Place 2 sprays into both nostrils as needed.),  Disp: 16 g, Rfl: 0 ?  ibuprofen (ADVIL) 800 MG tablet, Take 200 mg by mouth every 8 (eight) hours as needed., Disp: , Rfl:  ?  insulin glargine (LANTUS) 100 UNIT/ML Solostar Pen, Inject 14 Units into the skin daily. (Patient taking differently: Inject 26 Units into the skin daily.), Disp: 15 mL, Rfl: 1 ?  Insulin Pen Needle (NOVOFINE) 30G X 8 MM MISC, Use to inject insulin daily, Disp: 100 each, Rfl: 11 ?  losartan (COZAAR) 50 MG tablet, Take 1 tablet (50 mg total) by mouth at bedtime., Disp: 90 tablet, Rfl: 3 ?  metFORMIN (GLUCOPHAGE-XR) 750 MG 24 hr tablet, Take 1 tablet (750 mg total) by mouth 2 (two) times daily with a meal., Disp: 90 tablet, Rfl: 3 ?  Multiple Vitamins-Minerals (MULTIVITAMIN WITH MINERALS) tablet, Take 1 tablet by mouth daily., Disp: , Rfl:  ?  polyethylene glycol powder (GLYCOLAX/MIRALAX) 17 GM/SCOOP powder, Take 17 g by mouth 2 (two) times daily as needed., Disp: 3350 g, Rfl: 1 ?  Prucalopride Succinate (MOTEGRITY) 2 MG TABS, Take 1 tablet (2 mg total) by mouth daily., Disp: 30 tablet, Rfl: 11  ? ?Medications ordered in this encounter:  ?Meds ordered this encounter  ?Medications  ? azithromycin (ZITHROMAX) 250 MG tablet  ?  Sig: Take 2 tablets on day 1, then 1 tablet daily on days 2 through 5  ?  Dispense:  6 tablet  ?  Refill:  0  ?  Order Specific  Question:   Supervising Provider  ?  Answer:   Eber Hong [[id number]]  ? benzonatate (TESSALON) 100 MG capsule  ?  Sig: Take 1 capsule (100 mg total) by mouth 3 (three) times daily as needed.  ?  Dispense:  30 capsule  ?  Refill:  0  ?  Order Specific Question:   Supervising Provider  ?  Answer:   Eber Hong [[id number]]  ? fluticasone (FLOVENT HFA) 110 MCG/ACT inhaler  ?  Sig: Inhale 1 puff into the lungs in the morning and at bedtime.  ?  Dispense:  1 each  ?  Refill:  0  ?  Order Specific Question:   Supervising Provider  ?  Answer:   Eber Hong [[id number]]  ?  ? ?*If you need refills on other medications prior to your next appointment, please contact  your pharmacy* ? ?Follow-Up: ?Call back or seek an in-person evaluation if the symptoms worsen or if the condition fails to improve as anticipated. ? ?Other Instructions ?Asthma Attack ?Asthma attack, also called acute bronchospasm, is the sudden narrowing and tightening of the air passages, which limits the amount of oxygen that can get into the lungs. The narrowing is caused by inflammation and tightening of the muscles in the air tubes (bronchi) in the lungs. ?Too much mucus is also produced, which narrows the airways more. This can cause trouble breathing, loud breathing (wheezing), and coughing. The goal of treatment is to open the airways in the lungs and reduce inflammation. ?What are the causes? ?Possible causes or triggers of this condition include: ?Animal dander, dust mites, or cockroaches. ?Mold, pollen from trees or grass, or cold air. ?Air pollutants such as dust, household cleaners, aerosol sprays, strong chemicals, strong odors, and smoke of any kind. ?Stress or strong emotions such as crying or laughing hard. ?Exercise or activity that requires a lot of energy. ?Substances in foods and drinks, such as dried fruits and wine, called sulfites. ?Certain medicines or medical conditions such as: ?Aspirin or beta-blockers. ?Infections or inflammatory conditions, such as a flu (influenza), a cold, pneumonia, or inflammation of the nasal membranes (rhinitis). ?Gastroesophageal reflux disease (GERD). GERD is a condition in which stomach acid backs up into your esophagus and spills into your trachea (windpipe), which can irritate your airways. ?What are the signs or symptoms? ?Symptoms of this condition include: ?Wheezing. This may sound like whistling while breathing. This may only happen at night. ?Excessive coughing. This may only happen at night. ?Chest tightness or pain. ?Shortness of breath. ?Feeling like you cannot get enough air no matter how hard you breathe (air hunger). ?How is this diagnosed? ?This  condition may be diagnosed based on: ?Your medical history. ?Your symptoms. ?A physical exam. ?Tests to check for other causes of your symptoms or other conditions that may have triggered your asthma attack. These tests may include: ?A chest X-ray. ?Blood tests. ?Tests to assess lung function, such as breathing into a device that measures how much air you can inhale and exhale (spirometry). ?How is this treated? ?Treatment for this condition depends on the severity and cause of your asthma attack. ?For mild attacks, you may receive medicines through a hand-held inhaler (metered dose inhaler, or MDI) or through a device that turns liquid medicine into a mist (nebulizer). These medicines include: ?Quick relief or rescue medicines that quickly relax the airways and lungs. ?Long-acting medicines that are used daily to prevent (control) your asthma symptoms. ?For moderate or severe attacks, you may be treated with  steroid medicines by mouth or through an IV injection at the hospital. ?For severe attacks, you may need oxygen therapy or a breathing machine (ventilator). ?If your asthma attack was caused by an infection from bacteria, you will be given antibiotic medicines. ?Follow these instructions at home: ?Medicines ?Take over-the-counter and prescription medicines only as told by your health care provider. ?Keep your medicines up-to-date. ?Make sure you have all of your medicines available at all times. ?If you were prescribed an antibiotic medicine, take it as told by your health care provider. Do not stop taking the antibiotic even if you start to feel better. ?Tell your doctor if you may be pregnant to make sure your asthma medicine is safe to use during pregnancy. ?Avoiding triggers ? ?Keep track of things that trigger your asthma attacks. Avoid exposure to these triggers. ?Do not use any products that contain nicotine or tobacco, such as cigarettes, e-cigarettes, and chewing tobacco. If you need help quitting, ask  your health care provider. ?When there is a lot of pollen, air pollution, or humidity, keep windows closed and use an air conditioner or go to places with air conditioning. ?Asthma action plan ?Work w

## 2021-08-26 ENCOUNTER — Ambulatory Visit: Payer: BC Managed Care – PPO | Admitting: Family Medicine

## 2021-09-12 ENCOUNTER — Other Ambulatory Visit: Payer: Self-pay

## 2021-09-12 ENCOUNTER — Encounter: Payer: Self-pay | Admitting: Gastroenterology

## 2021-09-12 ENCOUNTER — Telehealth: Payer: Self-pay

## 2021-09-12 ENCOUNTER — Ambulatory Visit (AMBULATORY_SURGERY_CENTER): Payer: BC Managed Care – PPO | Admitting: Gastroenterology

## 2021-09-12 VITALS — BP 132/86 | HR 89 | Temp 97.8°F | Resp 18 | Ht 70.0 in | Wt 228.0 lb

## 2021-09-12 DIAGNOSIS — E1165 Type 2 diabetes mellitus with hyperglycemia: Secondary | ICD-10-CM

## 2021-09-12 DIAGNOSIS — K3189 Other diseases of stomach and duodenum: Secondary | ICD-10-CM

## 2021-09-12 DIAGNOSIS — K3184 Gastroparesis: Secondary | ICD-10-CM

## 2021-09-12 DIAGNOSIS — R112 Nausea with vomiting, unspecified: Secondary | ICD-10-CM

## 2021-09-12 MED ORDER — INSULIN GLARGINE 100 UNIT/ML SOLOSTAR PEN: 26.0000 [IU] | PEN_INJECTOR | Freq: Every day | SUBCUTANEOUS | 1 refills | Status: DC

## 2021-09-12 MED ORDER — SODIUM CHLORIDE 0.9 % IV SOLN: 500.0000 mL | Freq: Once | INTRAVENOUS | Status: DC

## 2021-09-12 NOTE — Progress Notes (Signed)
? ?Referring Provider: Westley Chandler, MD ?Primary Care Physician:  Westley Chandler, MD ? ?Indication for Procedure:  Nausea and vomiting ? ? ?IMPRESSION:  ?One year of nausea, vomiting, epigastric pain not explained by abdominal ultrasound. Liver enzymes and lipase are normal. ? ?EGD to evaluate nausea and vomiting ?Appropriate candidate for monitored anesthesia care ? ?PLAN: ?EGD in the [facility] today ? ? ?HPI: YERIEL MINEO is a 36 y.o. adult presents for endoscopic evaluation. ? ?History of type 2 diabetes that was previously poorly controlled but now under good control who has been experiencing intermittent nausea, vomiting, and epigastric pain after meals over the last year.  Symptoms have become progressively more frequent.  Even required ER visit earlier this month.  Rare heartburn with ongoing globus. Prior response to Nexium. No associated abdominal pain.  No identified food triggers.  No odynophagia or odynophagia.  Since being off Mounjaro, has nausea daily without vomiting. ?  ?Baseline bowel habits have not changed and she has a stool once or twice a week.  Once she went 10 days between bowel movements. There is associated bloating and a sense of incomplete evacuation and she has to strain.  She uses Miralax QD-BID PRN.  No blood or mucus in the stool, although she feels her stools are dark.   ?  ?The nausea, vomiting, and epigastric pain worsen as the constipation worsens.  ?  ?There is been minimal improvement with empiric Reglan. She stopped to avoid long term treatments.    ?Zofran provides some symptomatic relief. But, she stopped using it due to the potential side effect of constipation.  ?Not using any NSAIDs. ?  ?Change to a low carb diet did not provide any relief.  ?  ?No prior endoscopic evaluation. ?  ?Labs 07/11/2021 showed normal lipase, normal liver enzymes, normal hemoglobin, normal platelets ?Abdominal ultrasound 06/07/2021 showed no acute abnormalities ?  ?Mother with significant  gastroparesis due to diabetes. She is hopeful it's not gastroparesis after watching her mother. There is a family history of gastroparesis.  There is no known family history of colon cancer or polyps. No family history of stomach cancer or other GI malignancy. No family history of inflammatory bowel disease or celiac.  ?  ? ? ?Past Medical History:  ?Diagnosis Date  ? Diabetes mellitus without complication (HCC)   ? Dyslipidemia   ? Hypertension   ? Neuropathy   ? ? ?Past Surgical History:  ?Procedure Laterality Date  ? MOUTH SURGERY    ? WISDOM TOOTH EXTRACTION    ? ? ?Current Outpatient Medications  ?Medication Sig Dispense Refill  ? amLODipine (NORVASC) 5 MG tablet Take 1 tablet (5 mg total) by mouth at bedtime. 90 tablet 3  ? atorvastatin (LIPITOR) 20 MG tablet Take 1 tablet (20 mg total) by mouth at bedtime. 90 tablet 3  ? Continuous Blood Gluc Sensor (FREESTYLE LIBRE 3 SENSOR) MISC 1 application by Does not apply route as directed. Apply one sensor every 14 days 2 each 2  ? dapagliflozin propanediol (FARXIGA) 10 MG TABS tablet Take 1 tablet (10 mg total) by mouth daily before breakfast. 30 tablet 2  ? emtricitabine-tenofovir (TRUVADA) 200-300 MG tablet Take 1 tablet by mouth daily. 90 tablet 0  ? insulin glargine (LANTUS) 100 UNIT/ML Solostar Pen Inject 14 Units into the skin daily. (Patient taking differently: Inject 26 Units into the skin daily.) 15 mL 1  ? Insulin Pen Needle (NOVOFINE) 30G X 8 MM MISC Use to inject insulin daily  100 each 11  ? losartan (COZAAR) 50 MG tablet Take 1 tablet (50 mg total) by mouth at bedtime. 90 tablet 3  ? metFORMIN (GLUCOPHAGE-XR) 750 MG 24 hr tablet Take 1 tablet (750 mg total) by mouth 2 (two) times daily with a meal. 90 tablet 3  ? Multiple Vitamins-Minerals (MULTIVITAMIN WITH MINERALS) tablet Take 1 tablet by mouth daily.    ? albuterol (VENTOLIN HFA) 108 (90 Base) MCG/ACT inhaler Inhale 2 puffs into the lungs every 6 (six) hours as needed for wheezing or shortness of  breath. 18 g 3  ? benzonatate (TESSALON) 100 MG capsule Take 1 capsule (100 mg total) by mouth 3 (three) times daily as needed. 30 capsule 0  ? cetirizine (ZYRTEC) 10 MG tablet Take 10 mg by mouth daily.    ? fluticasone (FLONASE) 50 MCG/ACT nasal spray Place 2 sprays into both nostrils daily. (Patient taking differently: Place 2 sprays into both nostrils as needed.) 16 g 0  ? fluticasone (FLOVENT HFA) 110 MCG/ACT inhaler Inhale 1 puff into the lungs in the morning and at bedtime. 1 each 0  ? ibuprofen (ADVIL) 800 MG tablet Take 200 mg by mouth every 8 (eight) hours as needed.    ? polyethylene glycol powder (GLYCOLAX/MIRALAX) 17 GM/SCOOP powder Take 17 g by mouth 2 (two) times daily as needed. 3350 g 1  ? Prucalopride Succinate (MOTEGRITY) 2 MG TABS Take 1 tablet (2 mg total) by mouth daily. (Patient not taking: Reported on 09/12/2021) 30 tablet 11  ? ?Current Facility-Administered Medications  ?Medication Dose Route Frequency Provider Last Rate Last Admin  ? 0.9 %  sodium chloride infusion  500 mL Intravenous Once Tressia Danas, MD      ? ? ?Allergies as of 09/12/2021 - Review Complete 09/12/2021  ?Allergen Reaction Noted  ? Lisinopril Cough 12/13/2020  ? Metformin and related Diarrhea 12/13/2020  ? ? ?Family History  ?Problem Relation Age of Onset  ? Diabetes Mother   ?     died of complicatiosn of renal disease, DM   ? Diabetes Father   ? Healthy Sister   ? Diabetes Maternal Grandmother   ? Diabetes Maternal Grandfather   ? Diabetes Paternal Grandmother   ? Breast cancer Paternal Grandmother   ? Diabetes Paternal Grandfather   ? Colon cancer Neg Hx   ? Liver disease Neg Hx   ? Pancreatic cancer Neg Hx   ? ? ? ?Physical Exam: ?General:   Alert,  well-nourished, pleasant and cooperative in NAD ?Head:  Normocephalic and atraumatic. ?Eyes:  Sclera clear, no icterus.   Conjunctiva pink. ?Mouth:  No deformity or lesions.   ?Neck:  Supple; no masses or thyromegaly. ?Lungs:  Clear throughout to auscultation.   No  wheezes. ?Heart:  Regular rate and rhythm; no murmurs. ?Abdomen:  Soft, non-tender, nondistended, normal bowel sounds, no rebound or guarding.  ?Msk:  Symmetrical. No boney deformities ?LAD: No inguinal or umbilical LAD ?Extremities:  No clubbing or edema. ?Neurologic:  Alert and  oriented x4;  grossly nonfocal ?Skin:  No obvious rash or bruise. ?Psych:  Alert and cooperative. Normal mood and affect. ? ? ? ? ?Studies/Results: ?No results found. ? ? ? ?Lurene Robley L. Orvan Falconer, MD, MPH ?09/12/2021, 10:13 AM ? ? ? ?  ?

## 2021-09-12 NOTE — Patient Instructions (Signed)
Await pathology results from the biopsies taken today.. ? ?Resume previous diet and medications. ? ?Proceed with gastric emptying scan and office follow up afterwards. ? ? ?YOU HAD AN ENDOSCOPIC PROCEDURE TODAY AT [facility]:   Refer to the procedure report that was given to you for any specific questions about what was found during the examination.  If the procedure report does not answer your questions, please call your gastroenterologist to clarify.  If you requested that your care partner not be given the details of your procedure findings, then the procedure report has been included in a sealed envelope for you to review at your convenience later. ? ?YOU SHOULD EXPECT: Some feelings of bloating in the abdomen. Passage of more gas than usual.  Walking can help get rid of the air that was put into your GI tract during the procedure and reduce the bloating. If you had a lower endoscopy (such as a colonoscopy or flexible sigmoidoscopy) you may notice spotting of blood in your stool or on the toilet paper. If you underwent a bowel prep for your procedure, you may not have a normal bowel movement for a few days. ? ?Please Note:  You might notice some irritation and congestion in your nose or some drainage.  This is from the oxygen used during your procedure.  There is no need for concern and it should clear up in a day or so. ? ?SYMPTOMS TO REPORT IMMEDIATELY: ? ? ?Following upper endoscopy (EGD) ? Vomiting of blood or coffee ground material ? New chest pain or pain under the shoulder blades ? Painful or persistently difficult swallowing ? New shortness of breath ? Fever of 100?F or higher ? Black, tarry-looking stools ? ?For urgent or emergent issues, a gastroenterologist can be reached at any hour by calling [phone]. ?Do not use MyChart messaging for urgent concerns.  ? ? ?DIET:  We do recommend a small meal at first, but then you may proceed to your regular diet.  Drink plenty of fluids  but you should avoid alcoholic beverages for 24 hours. ? ?ACTIVITY:  You should plan to take it easy for the rest of today and you should NOT DRIVE or use heavy machinery until tomorrow (because of the sedation medicines used during the test).   ? ?FOLLOW UP: ?Our staff will call the number listed on your records 48-72 hours following your procedure to check on you and address any questions or concerns that you may have regarding the information given to you following your procedure. If we do not reach you, we will leave a message.  We will attempt to reach you two times.  During this call, we will ask if you have developed any symptoms of COVID 19. If you develop any symptoms (ie: fever, flu-like symptoms, shortness of breath, cough etc.) before then, please call [phone].  If you test positive for Covid 19 in the 2 weeks post procedure, please call and report this information to [place].   ? ?If any biopsies were taken you will be contacted by phone or by letter within the next 1-3 weeks.  Please call us at [phone] if you have not heard about the biopsies in 3 weeks.  ? ? ?SIGNATURES/CONFIDENTIALITY: ?You and/or your care partner have signed paperwork which will be entered into your electronic medical record.  These signatures attest to the fact that that the information above on your After Visit Summary has been reviewed and is understood.  Full responsibility  of the confidentiality of this discharge information lies with you and/or your care-partner.  ?

## 2021-09-12 NOTE — Op Note (Signed)
[facility] ?Patient Name: Carl [name] ?Procedure Date: 09/12/2021 10:11 AM ?MRN: [id number] ?Endoscopist: Tressia DanasKimberly Kaydin Labo MD, MD ?Age: 36 ?Referring MD:  ?Date of Birth: 1985/08/13 ?Gender: Male ?Account #: [id number] ?Procedure:                Upper GI endoscopy ?Indications:              Nausea with vomiting ?Medicines:                Monitored Anesthesia Care ?Procedure:                Pre-Anesthesia Assessment: ?                          - Prior to the procedure, a History and Physical  ?                          was performed, and patient medications and  ?                          allergies were reviewed. The patient's tolerance of  ?                          previous anesthesia was also reviewed. The risks  ?                          and benefits of the procedure and the sedation  ?                          options and risks were discussed with the patient.  ?                          All questions were answered, and informed consent  ?                          was obtained. Prior Anticoagulants: The patient has  ?                          taken no previous anticoagulant or antiplatelet  ?                          agents. ASA Grade Assessment: II - A patient with  ?                          mild systemic disease. After reviewing the risks  ?                          and benefits, the patient was deemed in  ?                          satisfactory condition to undergo the procedure. ?                          After obtaining informed consent, the endoscope was  ?  passed under direct vision. Throughout the  ?                          procedure, the patient's blood pressure, pulse, and  ?                          oxygen saturations were monitored continuously. The  ?                          GIF [id number] #[id number] was introduced through the  ?                          mouth, and advanced to the third part of duodenum.  ?                          The upper GI endoscopy was  accomplished without  ?                          difficulty. The patient tolerated the procedure  ?                          well. ?Scope In: ?Scope Out: ?Findings:                 The esophagus was normal. ?                          A large amount of food (residue) was found in the  ?                          cardia, in the gastric fundus and in the gastric  ?                          body. ?                          Patchy mildly erythematous mucosa without bleeding  ?                          was found in the gastric antrum. Biopsies were  ?                          taken from the antrum, body, and fundus with a cold  ?                          forceps for histology. Estimated blood loss was  ?                          minimal. ?                          The examined duodenum was normal. Biopsies were  ?                          taken with a cold forceps for histology. Estimated  ?  blood loss was minimal. ?                          The cardia and gastric fundus were normal on  ?                          retroflexion. ?                          The exam was otherwise without abnormality. ?Complications:            No immediate complications. ?Estimated Blood Loss:     Estimated blood loss was minimal. ?Impression:               - Normal esophagus. ?                          - A large amount of food (residue) in the stomach. ?                          - Erythematous mucosa in the antrum. Biopsied. ?                          - Normal examined duodenum. Biopsied. ?                          - The examination was otherwise normal. ?Recommendation:           - Patient has a contact number available for  ?                          emergencies. The signs and symptoms of potential  ?                          delayed complications were discussed with the  ?                          patient. Return to normal activities tomorrow.  ?                          Written discharge instructions were provided  to the  ?                          patient. ?                          - Resume previous diet. ?                          - Continue present medications. ?                          - Await pathology results. ?                          - Proceed with gastric emptying scan. ?                          -  Office follow-up after the gastric emptying scan. ?Tressia Danas MD, MD ?09/12/2021 10:32:24 AM ?This report has been signed electronically. ?

## 2021-09-12 NOTE — Telephone Encounter (Signed)
Per Dr. Orvan Falconer request, pt has been scheduled for f/u gastroparesis on 09/29/21 @ 11am. Appt reminder has been mailed.  ? ?Orders placed for GES. Message sent to Radiology Central Scheduling for scheduling purposes. Reminder created to ensure pt has been scheduled in a timely manner. ?

## 2021-09-12 NOTE — Telephone Encounter (Signed)
-----   Message from Aleatha Borer, LPN sent at 624THL 10:35 AM EDT ----- ?Please arrange for gastric emptying scan prior to office follow-up with me or Colleen. Thanks.  ?----- Message ----- ?From: Thornton Park, MD ?Sent: 09/12/2021  10:26 AM EDT ?To: Aleatha Borer, LPN ? ?Please schedule office follow-up with me or Jaclyn Shaggy - next available. Thanks. ? ?KLB ? ? ?

## 2021-09-12 NOTE — Progress Notes (Signed)
To pacu, VSS. Report to [email or web address].tb 

## 2021-09-12 NOTE — Progress Notes (Signed)
Called to room to assist during endoscopic procedure.  Patient ID and intended procedure confirmed with present staff. Received instructions for my participation in the procedure from the performing physician.  

## 2021-09-14 ENCOUNTER — Telehealth: Payer: Self-pay | Admitting: *Deleted

## 2021-09-14 ENCOUNTER — Telehealth: Payer: Self-pay

## 2021-09-14 NOTE — Telephone Encounter (Signed)
?  Follow up Call- ? ? ?  09/12/2021  ?  9:26 AM  ?Call back number  ?Post procedure Call Back phone  # [phone]  ?Permission to leave phone message Yes  ?  ?LMOM to call back with any questions or concerns.   ?

## 2021-09-14 NOTE — Telephone Encounter (Signed)
First post procedure follow up call, no answer 

## 2021-09-14 NOTE — Telephone Encounter (Signed)
Patient is returning call. Says is doing well . No questions at this time. ?

## 2021-09-15 ENCOUNTER — Encounter: Payer: Self-pay | Admitting: Gastroenterology

## 2021-09-19 ENCOUNTER — Encounter: Payer: Self-pay | Admitting: Gastroenterology

## 2021-09-23 ENCOUNTER — Other Ambulatory Visit: Payer: Self-pay

## 2021-09-23 MED ORDER — DAPAGLIFLOZIN PROPANEDIOL 10 MG PO TABS: 10.0000 mg | ORAL_TABLET | Freq: Every day | ORAL | 2 refills | Status: DC

## 2021-09-27 NOTE — Progress Notes (Unsigned)
     09/27/2021 Carl [name] [id number] 09-11-1985   Chief Complaint:  History of Present Illness: Carl [name]. Quach is a 36 year old male with a past medical history of a diabetes type II.  She was seen in office by Dr. Orvan Falconer 08/01/2021 for further evaluation regarding nausea, vomiting and epigastric pain.  Abdominal ultrasound 06/07/2021 showed no acute abnormalities. Normal LFTs and lipase levels. An EGD was recommended to rule out H. Pylori and celiac disease.  An EGD was completed 09/12/2021 which showed a normal esophagus, a large amount of food residue in the stomach and erythematous mucosa in the antrum.  Biopsies were negative for H. pylori and celiac disease.Gastroparesis was suspected and she was scheduled for a gastric empty study 10/13/2021 to confirm this diagnosis. She was advised to follow up in office after completing the GES.  She was prescribed Motegrity 2mg  po QD for her constipation.   EGD 09/12/2021: - Normal esophagus. - A large amount of food (residue) in the stomach. - Erythematous mucosa in the antrum. Biopsied. - Normal examined duodenum. Biopsied. - The examination was otherwise normal. 1. Surgical [P], duodenal - BENIGN DUODENAL MUCOSA - NO ACUTE INFLAMMATION, VILLOUS BLUNTING OR INCREASED INTRAEPITHELIAL LYMPHOCYTES IDENTIFIED 2. Surgical [P], gastric antrum and gastric body - BENIGN GASTRIC MUCOSA - NO H. PYLORI, INTESTINAL METAPLASIA OR MALIGNANCY IDENTIFIED  RUQ sono 06/09/2021: Gallbladder: No gallstones or wall thickening visualized. No sonographic Murphy sign noted by sonographer.   Common bile duct: Diameter: 2.0 cm.   Liver: No focal lesion identified. Within normal limits in parenchymal echogenicity. Portal vein is patent on color Doppler imaging with normal direction of blood flow towards the liver.   IMPRESSION: No acute abnormality noted.  Current Medications, Allergies, Past Medical History, Past Surgical History, Family History and  Social History were reviewed in 08/07/2021 record.   Review of Systems:   Constitutional: Negative for fever, sweats, chills or weight loss.  Respiratory: Negative for shortness of breath.   Cardiovascular: Negative for chest pain, palpitations and leg swelling.  Gastrointestinal: See HPI.  Musculoskeletal: Negative for back pain or muscle aches.  Neurological: Negative for dizziness, headaches or paresthesias.    Physical Exam: There were no vitals taken for this visit. General: Well developed, w   ***male in no acute distress. Head: Normocephalic and atraumatic. Eyes: No scleral icterus. Conjunctiva pink . Ears: Normal auditory acuity. Mouth: Dentition intact. No ulcers or lesions.  Lungs: Clear throughout to auscultation. Heart: Regular rate and rhythm, no murmur. Abdomen: Soft, nontender and nondistended. No masses or hepatomegaly. Normal bowel sounds x 4 quadrants.  Rectal: *** Musculoskeletal: Symmetrical with no gross deformities. Extremities: No edema. Neurological: Alert oriented x 4. No focal deficits.  Psychological: Alert and cooperative. Normal mood and affect  Assessment and Recommendations: ***

## 2021-09-28 ENCOUNTER — Telehealth: Payer: Self-pay

## 2021-09-28 NOTE — Telephone Encounter (Signed)
SECOND ATTEMPT:  LVM requesting returned call. My Chart message sent informing pt about newly scheduled appt as seen below.  Pt had previously communicated with our office re: needing to reschedule appt but failed to call office to reschedule despite having read My Chart message. Refer to My Chart communication below:  Conversation: Appointment  [facility]) Sep 28, 2021 Me to IZEN PETZ "Carl [name]"      2:13 PM Hi Carl [name],   Our office staff have been trying to reach you to reschedule your follow up appointment but have not been successful in reaching you. Given the date of your Gastric Emptying Study, you follow up appointment has been rescheduled to 10/24/21 @ 9am. Your results will be discussed during this appointment. If you are unable to attend this appointment, please call the office at [phone] to reschedule your appointment.   Thank you  This MyChart message has not been read. Sep 20, 2021 Me to Carl [name] "Carl [name]"      7:48 AM Hi Carl [name],   Please call the office to reschedule your follow up appointment. Your provider intends to review the results of your Gastric Emptying Study during your follow up appointment.    Thank you  Last read by Carl [name] "Carl [name]" at 11:16 AM on 09/20/2021. Sep 19, 2021 Carl [name] "Carl [name]" to [facility] (supporting Tressia Danas, MD)      5:16 PM Good evening,    Due to work constraints and the schedule at radiology I am not able to get the Gastric Emptying scan completed until 6/8. Did you still want me to come in for the follow up or reschedule for after the scan is completed?    Thanks,  Carl [name]  [phone]

## 2021-09-28 NOTE — Telephone Encounter (Signed)
-----   Message from Noralyn Pick, NP sent at 09/27/2021  4:15 PM EDT ----- Carl [name], patient is scheduled for a follow up appt with me on 5/25. Per Dr. Tarri Glenn' prior message, patient was to undergo the gastric empty study first then follow up in the office so we can review the results.   Refer to phone msg 09/12/2021 as follows:  Per Dr. Tarri Glenn request, pt has been scheduled for f/u gastroparesis on 09/29/21 @ 11am. Appt reminder has been mailed.   Orders placed for GES. Message sent to Radiology Central Scheduling for scheduling purposes. Reminder created to ensure pt has been scheduled in a timely manner.   Aleatha Borer, LPN    624THL PM Note    ----- Message from Aleatha Borer, LPN sent at 624THL 10:35 AM EDT ----- Please arrange for gastric emptying scan prior to office follow-up with me or Colleen. Thanks.  ----- Message ----- From: Thornton Park, MD Sent: 09/12/2021  10:26 AM EDT To: Aleatha Borer, LPN  Please schedule office follow-up with me or Colleen - next available. Thanks.  KLB

## 2021-09-28 NOTE — Telephone Encounter (Signed)
GES scheduled 6/8. Appt with Alcide Evener, CRNP for 09/29/21 has ben canceled and needing to be rescheduled FOLLOWING 10/13/21 GES. Left a detailed VM informing pt appt has been canceled and requesting a call back to reschedule f/u appt AFTER 10/13/21.

## 2021-09-29 ENCOUNTER — Ambulatory Visit: Payer: BC Managed Care – PPO | Admitting: Nurse Practitioner

## 2021-09-29 NOTE — Telephone Encounter (Signed)
Received call from [facility] indicating pt had canceled 10/24/21 f/u appt and rescheduled to 11/04/21. GES remains as scheduled for 10/13/21.

## 2021-09-30 ENCOUNTER — Other Ambulatory Visit: Payer: Self-pay

## 2021-09-30 MED ORDER — FREESTYLE LIBRE 3 SENSOR MISC: 1.0000 "application " | 2 refills | Status: DC

## 2021-10-07 ENCOUNTER — Other Ambulatory Visit: Payer: Self-pay

## 2021-10-07 ENCOUNTER — Ambulatory Visit (INDEPENDENT_AMBULATORY_CARE_PROVIDER_SITE_OTHER): Payer: BC Managed Care – PPO | Admitting: Family Medicine

## 2021-10-07 ENCOUNTER — Encounter: Payer: Self-pay | Admitting: Family Medicine

## 2021-10-07 VITALS — BP 139/91 | HR 97 | Wt 243.0 lb

## 2021-10-07 DIAGNOSIS — E11319 Type 2 diabetes mellitus with unspecified diabetic retinopathy without macular edema: Secondary | ICD-10-CM | POA: Diagnosis not present

## 2021-10-07 DIAGNOSIS — Z789 Other specified health status: Secondary | ICD-10-CM

## 2021-10-07 DIAGNOSIS — Z113 Encounter for screening for infections with a predominantly sexual mode of transmission: Secondary | ICD-10-CM

## 2021-10-07 DIAGNOSIS — I10 Essential (primary) hypertension: Secondary | ICD-10-CM

## 2021-10-07 DIAGNOSIS — E785 Hyperlipidemia, unspecified: Secondary | ICD-10-CM

## 2021-10-07 LAB — POCT GLYCOSYLATED HEMOGLOBIN (HGB A1C): HbA1c, POC (controlled diabetic range): 6.4 % (ref 0.0–7.0)

## 2021-10-07 MED ORDER — TIRZEPATIDE 2.5 MG/0.5ML ~~LOC~~ SOAJ: 2.5000 mg | SUBCUTANEOUS | 0 refills | Status: DC

## 2021-10-07 MED ORDER — MOTEGRITY 2 MG PO TABS: 2.0000 mg | ORAL_TABLET | Freq: Every day | ORAL | 11 refills | Status: DC

## 2021-10-07 MED ORDER — TIRZEPATIDE 5 MG/0.5ML ~~LOC~~ SOAJ: 5.0000 mg | SUBCUTANEOUS | 0 refills | Status: DC

## 2021-10-07 NOTE — Patient Instructions (Addendum)
It was wonderful to see you today.  Please bring ALL of your medications with you to every visit.   Today we talked about:  --I will send in your Truvada ---Estrogen patches--typically I start at 100 mcg---some patches are changed twice a week   - I sent in Mounjaro---let me know if too expensive   Follow up in 1 month to talk about medications and titrate mounjaro    Thank you for choosing [facility].   Please call [phone] with any questions about today's appointment.  Please be sure to schedule follow up at the front  desk before you leave today.   Terisa Starr, MD  Family Medicine

## 2021-10-07 NOTE — Progress Notes (Signed)
    SUBJECTIVE:   CHIEF COMPLAINT: diabetes, lower mood, weight HPI:   Carl [name] is a 36 y.o.  with history notable for obesity, hypertension, and now very well controlled diabetes presenting for follow up.  A1C today is 6.4 Had 1 low in 80s due to not eating (work is very busy, only front desk person.Taking lantus 40 U daily and metformin maximal dose. No polyuria or polydipsia. Abdominal issues are entirely unchanged after months off GLP/GIP agonist--would like to go back on tirzepatide.    In regards to her stomach, Carl [name] continues to have pain, early satiety, constant nausea. Has had no more emesis. Not using anything for nausea or promotility. Having 1 BM per day at least, using Miralax once or twice daily every day.   Hypertension- taking amlodipine 5 mg. No headaches, chest pain, dyspnea.   Carl [name] would like to consider starting hormone therapy for transition.   PERTINENT  PMH / PSH/Family/Social History : updated and reviewd   OBJECTIVE:   BP (!) 139/91   Pulse 97   Wt 243 lb (110.2 kg)   SpO2 100%   BMI 34.87 kg/m   Today's weight:  Last Weight  Most recent update: 10/07/2021  9:14 AM    Weight  110.2 kg (243 lb)            Review of prior weights: [employer]   10/07/21 0914  Weight: 243 lb (110.2 kg)     Cardiac: Regular rate and rhythm. Normal S1/S2. No murmurs, rubs, or gallops appreciated. Lungs: Clear bilaterally to ascultation.  Abdomen: Normoactive bowel sounds. No tenderness to deep or light palpation. No rebound or guarding.   Psych: Pleasant and appropriate    ASSESSMENT/PLAN:   PREP Therapy  PREP monitoring labs today.  Recent metabolic panel appropriate.  Prefers Male Pronouns--- Carl [name]  Desires to start hormone replacement therapy.  Lipid panel today if appropriate will start estradiol patches.  There is no family history of venous thromboembolic disease.  Essential hypertension Improved and near goal.  Currently on amlodipine and  losartan.  Consider addition of spironolactone in future if this remains elevated.  Diabetes ([place]) Reduce insulin to 35 units.  Start to [place].  First 2 months worth given.  Patient has been on this before.   Low mood, I suspect this is in part due to weight gain and the fact that she is not on gender affirming therapy.  Plan as above.  Continue to monitor mood.  Denies SI or HI.  Her job is quite stressful.  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  Suspected gastroparesis, she has failed multiple agents.  Reglan did not help.  Her diabetes is now well controlled. Unable to get Motegrity-represcribed today for chronic constipation.      Dorris Singh, [place]

## 2021-10-07 NOTE — Assessment & Plan Note (Signed)
Reduce insulin to 35 units.  Start to [place].  First 2 months worth given.  Patient has been on this before.

## 2021-10-07 NOTE — Assessment & Plan Note (Signed)
Desires to start hormone replacement therapy.  Lipid panel today if appropriate will start estradiol patches.  There is no family history of venous thromboembolic disease.

## 2021-10-07 NOTE — Assessment & Plan Note (Signed)
PREP monitoring labs today.  Recent metabolic panel appropriate.

## 2021-10-07 NOTE — Assessment & Plan Note (Signed)
Improved and near goal.  Currently on amlodipine and losartan.  Consider addition of spironolactone in future if this remains elevated.

## 2021-10-08 ENCOUNTER — Telehealth: Payer: Self-pay | Admitting: Family Medicine

## 2021-10-08 DIAGNOSIS — Z789 Other specified health status: Secondary | ICD-10-CM

## 2021-10-08 DIAGNOSIS — Z9189 Other specified personal risk factors, not elsewhere classified: Secondary | ICD-10-CM

## 2021-10-08 LAB — HCV INTERPRETATION

## 2021-10-08 LAB — HIV ANTIBODY (ROUTINE TESTING W REFLEX): HIV Screen 4th Generation wRfx: NONREACTIVE

## 2021-10-08 LAB — RPR: RPR Ser Ql: NONREACTIVE

## 2021-10-08 LAB — HCV AB W REFLEX TO QUANT PCR: HCV Ab: NONREACTIVE

## 2021-10-08 LAB — LDL CHOLESTEROL, DIRECT: LDL Direct: 82 mg/dL (ref 0–99)

## 2021-10-08 MED ORDER — EMTRICITABINE-TENOFOVIR DF 200-300 MG PO TABS: 1.0000 | ORAL_TABLET | Freq: Every day | ORAL | 0 refills | Status: DC

## 2021-10-08 MED ORDER — ESTRADIOL 0.1 MG/24HR TD PTTW: 1.0000 | MEDICATED_PATCH | TRANSDERMAL | 3 refills | Status: DC

## 2021-10-08 NOTE — Telephone Encounter (Signed)
Sent message via mychart.  PREP labs negative. Refilled Truvada.  Reviewed benefits vs. Risks of starting gender affirming therapy with patient. Rx. For estradiol to pharmacy based upon discussion, lipid profile.   Terisa Starr, MD  Family Medicine Teaching Service

## 2021-10-10 ENCOUNTER — Telehealth: Payer: Self-pay | Admitting: Family Medicine

## 2021-10-10 NOTE — Telephone Encounter (Signed)
Attempted to call patient. Reached voicemail, left generic voicemail to call back.   Lekisha Mcghee, MD  Family Medicine Teaching Service   

## 2021-10-11 ENCOUNTER — Encounter: Payer: Self-pay | Admitting: *Deleted

## 2021-10-11 ENCOUNTER — Other Ambulatory Visit (HOSPITAL_COMMUNITY): Payer: Self-pay

## 2021-10-11 ENCOUNTER — Telehealth: Payer: Self-pay

## 2021-10-11 NOTE — Telephone Encounter (Signed)
A Prior Authorization was initiated for this patients MOTEGRITY through [email or web address] (insurance recommended).   Pa (EOC ID: [id number]

## 2021-10-13 ENCOUNTER — Ambulatory Visit: Payer: BC Managed Care – PPO

## 2021-10-13 ENCOUNTER — Encounter: Payer: Self-pay | Admitting: Family Medicine

## 2021-10-13 DIAGNOSIS — E1165 Type 2 diabetes mellitus with hyperglycemia: Secondary | ICD-10-CM

## 2021-10-13 NOTE — Telephone Encounter (Signed)
Prior Auth for patients medication MOTEGRITY denied.  Reason: medication can only be approved if patient has tried and failed Linzess.  Denial letter scanned to chart.

## 2021-10-14 MED ORDER — INSULIN GLARGINE 100 UNIT/ML SOLOSTAR PEN: 40.0000 [IU] | PEN_INJECTOR | Freq: Every day | SUBCUTANEOUS | 3 refills | Status: DC

## 2021-10-17 ENCOUNTER — Telehealth: Payer: Self-pay

## 2021-10-17 NOTE — Telephone Encounter (Signed)
-----   Message from Deon Pilling, LPN sent at 6/81/2751 12:18 PM EDT ----- Regarding: FW: Gastric Emptying Study Scheduled 6/8. Did pt attend? ----- Message ----- From: Deon Pilling, LPN Sent: 7/0/0174  12:55 PM EDT To: April H Pait, Marylynn Pearson, # Subject: Gastric Emptying Study                         RADIOLOGY SCHEDULING REQUEST - PLEASE SCHEDULE WITHIN THE NEXT WEEK  [facility] Phone: [phone] Fax: [phone]   Imaging Ordered: Gastric Emptying Study  Diagnosis: Retained gastric contents, gastroparesis, N/V  Ordering Provider: Dr. Orvan Falconer  Is a Prior Authorization needed? We are in the process of obtaining it now  Thank you for your assistance! [facility] Team

## 2021-10-17 NOTE — Telephone Encounter (Signed)
Left message for pt to call back  °

## 2021-10-18 NOTE — Telephone Encounter (Signed)
Chart reviewed. Pt has been scheduled for a Gastric Emptying scan on 10/21/2021 at 8:00 AM Pt has been scheduled for a follow up appt with Nat Math NP on 11/04/2021 at 3:00  to discuss results:

## 2021-10-20 ENCOUNTER — Encounter: Payer: Self-pay | Admitting: Family Medicine

## 2021-10-20 DIAGNOSIS — K5904 Chronic idiopathic constipation: Secondary | ICD-10-CM

## 2021-10-20 MED ORDER — LINACLOTIDE 72 MCG PO CAPS: 72.0000 ug | ORAL_CAPSULE | Freq: Every day | ORAL | 2 refills | Status: DC

## 2021-10-21 ENCOUNTER — Other Ambulatory Visit (HOSPITAL_COMMUNITY): Payer: Self-pay

## 2021-10-21 ENCOUNTER — Encounter: Payer: Self-pay | Admitting: Family Medicine

## 2021-10-21 ENCOUNTER — Encounter
Admission: RE | Admit: 2021-10-21 | Discharge: 2021-10-21 | Disposition: A | Discharge status: Home / Self Care | Payer: BC Managed Care – PPO | Source: Ambulatory Visit | Attending: Gastroenterology | Admitting: Gastroenterology

## 2021-10-21 ENCOUNTER — Telehealth: Payer: Self-pay

## 2021-10-21 DIAGNOSIS — R112 Nausea with vomiting, unspecified: Secondary | ICD-10-CM

## 2021-10-21 DIAGNOSIS — K3184 Gastroparesis: Secondary | ICD-10-CM | POA: Diagnosis present

## 2021-10-21 DIAGNOSIS — K3189 Other diseases of stomach and duodenum: Secondary | ICD-10-CM | POA: Diagnosis present

## 2021-10-21 DIAGNOSIS — G8929 Other chronic pain: Secondary | ICD-10-CM | POA: Insufficient documentation

## 2021-10-21 MED ORDER — TECHNETIUM TC 99M SULFUR COLLOID
2.0000 | Freq: Once | INTRAVENOUS | Status: AC
  Administered 2021-10-21: 2.23 via ORAL

## 2021-10-21 NOTE — Telephone Encounter (Signed)
Prior Auth for patients medication LINZESS approved from 10/21/21 to 10/21/22.  APPROVAL LETTER SCANNED TO CHART

## 2021-10-21 NOTE — Telephone Encounter (Signed)
A Prior Authorization was initiated for this patients LINZESS through insurance site [employer].   PriorAuth EOC ID: [id number]

## 2021-10-24 ENCOUNTER — Ambulatory Visit: Payer: BC Managed Care – PPO | Admitting: Nurse Practitioner

## 2021-11-04 ENCOUNTER — Ambulatory Visit: Payer: BC Managed Care – PPO | Admitting: Nurse Practitioner

## 2021-11-28 ENCOUNTER — Other Ambulatory Visit: Payer: Self-pay | Admitting: *Deleted

## 2021-11-28 DIAGNOSIS — E11319 Type 2 diabetes mellitus with unspecified diabetic retinopathy without macular edema: Secondary | ICD-10-CM

## 2021-11-28 MED ORDER — TIRZEPATIDE 5 MG/0.5ML ~~LOC~~ SOAJ: 5.0000 mg | SUBCUTANEOUS | 0 refills | Status: DC

## 2021-12-17 ENCOUNTER — Other Ambulatory Visit: Payer: Self-pay

## 2021-12-17 DIAGNOSIS — I10 Essential (primary) hypertension: Secondary | ICD-10-CM

## 2021-12-17 MED ORDER — LOSARTAN POTASSIUM 50 MG PO TABS: 50.0000 mg | ORAL_TABLET | Freq: Every day | ORAL | 3 refills | Status: DC

## 2021-12-19 ENCOUNTER — Encounter: Payer: BC Managed Care – PPO | Admitting: Family Medicine

## 2021-12-19 NOTE — Progress Notes (Unsigned)
    SUBJECTIVE:   CHIEF COMPLAINT: follow up medication start HPI:   Carl [name]  is a 36 y.o.  with history notable for type 2 DM, HTN, and chronic abdominal pain with constipation presenting for follow up after starting estrogen therapy for MTF transition (medical, already fully socially transitioned).   PERTINENT  PMH / PSH/Family/Social History : ***  OBJECTIVE:   There were no vitals taken for this visit.  Today's weight:  Review of prior weights: There were no vitals filed for this visit.  ***  ASSESSMENT/PLAN:   No problem-specific Assessment & Plan notes found for this encounter.     {    This will disappear when note is signed, click to select method of visit    :1}  Terisa Starr, MD  Family Medicine Teaching Service  [facility]

## 2021-12-20 ENCOUNTER — Other Ambulatory Visit: Payer: Self-pay | Admitting: *Deleted

## 2021-12-20 MED ORDER — FREESTYLE LIBRE 3 SENSOR MISC: 1.0000 "application " | 2 refills | Status: DC

## 2021-12-27 ENCOUNTER — Other Ambulatory Visit: Payer: Self-pay | Admitting: *Deleted

## 2021-12-27 DIAGNOSIS — E11319 Type 2 diabetes mellitus with unspecified diabetic retinopathy without macular edema: Secondary | ICD-10-CM

## 2021-12-27 MED ORDER — DAPAGLIFLOZIN PROPANEDIOL 10 MG PO TABS: 10.0000 mg | ORAL_TABLET | Freq: Every day | ORAL | 2 refills | Status: DC

## 2021-12-27 MED ORDER — TIRZEPATIDE 5 MG/0.5ML ~~LOC~~ SOAJ: 5.0000 mg | SUBCUTANEOUS | 0 refills | Status: DC

## 2022-01-27 ENCOUNTER — Other Ambulatory Visit: Payer: Self-pay

## 2022-01-27 DIAGNOSIS — E11319 Type 2 diabetes mellitus with unspecified diabetic retinopathy without macular edema: Secondary | ICD-10-CM

## 2022-01-27 MED ORDER — TIRZEPATIDE 5 MG/0.5ML ~~LOC~~ SOAJ: 5.0000 mg | SUBCUTANEOUS | 0 refills | Status: DC

## 2022-01-30 ENCOUNTER — Other Ambulatory Visit: Payer: Self-pay | Admitting: *Deleted

## 2022-01-30 MED ORDER — METFORMIN HCL ER 750 MG PO TB24
750.0000 mg | ORAL_TABLET | Freq: Two times a day (BID) | ORAL | 3 refills | Status: DC
Start: 2022-01-30 — End: 2022-03-03

## 2022-02-20 ENCOUNTER — Telehealth: Payer: BC Managed Care – PPO | Admitting: Emergency Medicine

## 2022-02-20 DIAGNOSIS — J069 Acute upper respiratory infection, unspecified: Secondary | ICD-10-CM

## 2022-02-20 MED ORDER — FLUTICASONE PROPIONATE 50 MCG/ACT NA SUSP: 2.0000 | Freq: Every day | NASAL | 0 refills | Status: DC

## 2022-02-20 NOTE — Progress Notes (Signed)

## 2022-03-02 ENCOUNTER — Encounter: Payer: Self-pay | Admitting: Family Medicine

## 2022-03-03 ENCOUNTER — Encounter: Payer: Self-pay | Admitting: Family Medicine

## 2022-03-03 ENCOUNTER — Telehealth (INDEPENDENT_AMBULATORY_CARE_PROVIDER_SITE_OTHER): Payer: BC Managed Care – PPO | Admitting: Family Medicine

## 2022-03-03 DIAGNOSIS — I10 Essential (primary) hypertension: Secondary | ICD-10-CM

## 2022-03-03 DIAGNOSIS — O09892 Supervision of other high risk pregnancies, second trimester: Secondary | ICD-10-CM

## 2022-03-03 DIAGNOSIS — G629 Polyneuropathy, unspecified: Secondary | ICD-10-CM

## 2022-03-03 DIAGNOSIS — R059 Cough, unspecified: Secondary | ICD-10-CM

## 2022-03-03 DIAGNOSIS — E1165 Type 2 diabetes mellitus with hyperglycemia: Secondary | ICD-10-CM

## 2022-03-03 DIAGNOSIS — Z789 Other specified health status: Secondary | ICD-10-CM

## 2022-03-03 DIAGNOSIS — Z9189 Other specified personal risk factors, not elsewhere classified: Secondary | ICD-10-CM

## 2022-03-03 DIAGNOSIS — E1140 Type 2 diabetes mellitus with diabetic neuropathy, unspecified: Secondary | ICD-10-CM

## 2022-03-03 DIAGNOSIS — E785 Hyperlipidemia, unspecified: Secondary | ICD-10-CM

## 2022-03-03 DIAGNOSIS — Z794 Long term (current) use of insulin: Secondary | ICD-10-CM

## 2022-03-03 DIAGNOSIS — Z113 Encounter for screening for infections with a predominantly sexual mode of transmission: Secondary | ICD-10-CM

## 2022-03-03 MED ORDER — ATORVASTATIN CALCIUM 20 MG PO TABS: 20.0000 mg | ORAL_TABLET | Freq: Every day | ORAL | 3 refills | Status: DC

## 2022-03-03 MED ORDER — GABAPENTIN 100 MG PO CAPS: 100.0000 mg | ORAL_CAPSULE | Freq: Three times a day (TID) | ORAL | 3 refills | Status: DC

## 2022-03-03 MED ORDER — AMLODIPINE BESYLATE 5 MG PO TABS: 5.0000 mg | ORAL_TABLET | Freq: Every day | ORAL | 3 refills | Status: DC

## 2022-03-03 MED ORDER — EMTRICITABINE-TENOFOVIR DF 200-300 MG PO TABS: 1.0000 | ORAL_TABLET | Freq: Every day | ORAL | 0 refills | Status: DC

## 2022-03-03 MED ORDER — TIRZEPATIDE 7.5 MG/0.5ML ~~LOC~~ SOAJ: 7.5000 mg | SUBCUTANEOUS | 1 refills | Status: DC

## 2022-03-03 MED ORDER — ESTRADIOL 0.1 MG/24HR TD PTTW: 1.0000 | MEDICATED_PATCH | TRANSDERMAL | 1 refills | Status: DC

## 2022-03-03 MED ORDER — DAPAGLIFLOZIN PROPANEDIOL 10 MG PO TABS: 10.0000 mg | ORAL_TABLET | Freq: Every day | ORAL | 3 refills | Status: DC

## 2022-03-03 MED ORDER — METFORMIN HCL ER 750 MG PO TB24: 750.0000 mg | ORAL_TABLET | Freq: Two times a day (BID) | ORAL | 3 refills | Status: DC

## 2022-03-03 MED ORDER — INSULIN GLARGINE 100 UNIT/ML SOLOSTAR PEN: 30.0000 [IU] | PEN_INJECTOR | Freq: Every day | SUBCUTANEOUS | 3 refills | Status: DC

## 2022-03-03 NOTE — Assessment & Plan Note (Signed)
Different formulation of estradiol patches sent---Discussed adverse effects. Start spironolactone at follow up pending labs Monitoring labs today Trial of different twice weekly formulation sent to pharmacy

## 2022-03-03 NOTE — Assessment & Plan Note (Signed)
Monitoring labs today

## 2022-03-03 NOTE — Progress Notes (Signed)
[facility] Visit  Patient consented to have virtual visit and was identified by name and date of birth. Method of visit: Video  Encounter participants: Patient: Carl [name] - located at work Provider: Martyn Malay - located at office   Chief Complaint: nerve pain, ongoing cough   HPI: Carl [name] Is a very pleasant 36 year old with history significant for type 2 diabetes, suspected gastroparesis, hypertension presenting today for follow-up via telemedicine visit.  Since her last visit she reports she is doing well.  She is taking her diabetes medications as prescribed including Mounjaro insulin metformin and [place].  She reports nighttime lows in the 60s.  Morning sugars in low 1 teens.  No hyperglycemia.  She does report continued intermittent shooting pains in her feet, she previously been seen by neurology for this and thought to be due to diabetes.  She is wondering for something to take as needed for this.  This happens a few times a week and is very painful to her and bothers her most of the day.  Patient has about a 7-day history of upper respiratory symptoms.  She reports cough and congestion.  No chest pain or difficulty breathing.  She has been using her inhaler.  She is a non-smoker.  No fevers.  The patient reports difficulty with her estradiol patches.  They keep falling off.  She is interested in a different formulation.  She has tried shaving and different placement of the Vivelle-Dot's.  The patient reports no issues with her Truvada.  She is interested in a refill today and is due for labs.  ROS: per HPI  Pertinent PMHx: Type 2 diabetes, remainder reviewed in problems.  Exam:  Pleasant and appropriate   Assessment/Plan:  Diabetes ([place]) Reduce Lantus to 30 units.  Increased Mounjaro to 7.5 mg.  Follow-up in 4 weeks.  Discussed reasons to call and return to care.  A1c ordered.  Due for urine albumin to creatinine when she returns  to care.. Referral placed for ophthalmology.  Polyneuropathy We will trial low-dose gabapentin.  Vitamin B12 today.  Also consider ferritin at follow-up differential also includes restless leg syndrome.  PREP Therapy  Monitoring labs today.  Prefers Male Pronouns--- Carl [name]  Different formulation of estradiol patches sent---Discussed adverse effects. Start spironolactone at follow up pending labs Monitoring labs today Trial of different twice weekly formulation sent to pharmacy    Post-Viral Cough Most likely related to ongoing viral symptoms.  No fevers or dyspnea concerning for pneumonia or complicating bacterial infection at this time.  She will call if this is not improved next week.  She does have history of sinusitis. Time spent during visit with patient: 16 minutes

## 2022-03-03 NOTE — Assessment & Plan Note (Signed)
We will trial low-dose gabapentin.  Vitamin B12 today.  Also consider ferritin at follow-up differential also includes restless leg syndrome.

## 2022-03-03 NOTE — Assessment & Plan Note (Addendum)
Reduce Lantus to 30 units.  Increased Mounjaro to 7.5 mg.  Follow-up in 4 weeks.  Discussed reasons to call and return to care.  A1c ordered.  Due for urine albumin to creatinine when she returns to care.. Referral placed for ophthalmology.

## 2022-03-14 ENCOUNTER — Other Ambulatory Visit: Payer: Self-pay | Admitting: Family Medicine

## 2022-03-15 LAB — BASIC METABOLIC PANEL
BUN/Creatinine Ratio: 13 (ref 9–20)
BUN: 13 mg/dL (ref 6–20)
CO2: 21 mmol/L (ref 20–29)
Calcium: 10 mg/dL (ref 8.7–10.2)
Chloride: 103 mmol/L (ref 96–106)
Creatinine, Ser: 0.98 mg/dL (ref 0.76–1.27)
Glucose: 128 mg/dL — ABNORMAL HIGH (ref 70–99)
Potassium: 4.3 mmol/L (ref 3.5–5.2)
Sodium: 141 mmol/L (ref 134–144)
eGFR: 104 mL/min/{1.73_m2} (ref >59)

## 2022-03-15 LAB — ESTRADIOL: Estradiol: 52.5 pg/mL — ABNORMAL HIGH (ref 7.6–42.6)

## 2022-03-15 LAB — HIV ANTIBODY (ROUTINE TESTING W REFLEX): HIV Screen 4th Generation wRfx: NONREACTIVE

## 2022-03-15 LAB — HEPATIC FUNCTION PANEL
ALT: 59 IU/L — ABNORMAL HIGH (ref 0–44)
AST: 40 IU/L (ref 0–40)
Albumin: 4.7 g/dL (ref 4.1–5.1)
Alkaline Phosphatase: 119 IU/L (ref 44–121)
Bilirubin Total: 0.3 mg/dL (ref 0.0–1.2)
Bilirubin, Direct: 0.13 mg/dL (ref 0.00–0.40)
Total Protein: 6.8 g/dL (ref 6.0–8.5)

## 2022-03-15 LAB — HCV AB W REFLEX TO QUANT PCR: HCV Ab: NONREACTIVE

## 2022-03-15 LAB — VITAMIN B12: Vitamin B-12: 978 pg/mL (ref 232–1245)

## 2022-03-15 LAB — HGB A1C W/O EAG: Hgb A1c MFr Bld: 6 % — ABNORMAL HIGH (ref 4.8–5.6)

## 2022-03-15 LAB — TESTOSTERONE: Testosterone: 109 ng/dL — ABNORMAL LOW (ref 264–916)

## 2022-03-15 LAB — RPR QUALITATIVE: RPR Ser Ql: NONREACTIVE

## 2022-03-15 LAB — HCV INTERPRETATION

## 2022-04-07 ENCOUNTER — Ambulatory Visit: Payer: BC Managed Care – PPO | Admitting: Family Medicine

## 2022-04-10 ENCOUNTER — Other Ambulatory Visit: Payer: Self-pay

## 2022-04-10 DIAGNOSIS — E1165 Type 2 diabetes mellitus with hyperglycemia: Secondary | ICD-10-CM

## 2022-04-10 MED ORDER — INSULIN PEN NEEDLE 30G X 8 MM MISC: 11 refills | Status: DC

## 2022-04-24 ENCOUNTER — Other Ambulatory Visit: Payer: Self-pay | Admitting: Family Medicine

## 2022-04-24 MED ORDER — ULTICARE SHORT PEN NEEDLES 31G X 8 MM MISC: 3 refills | Status: AC

## 2022-04-28 ENCOUNTER — Other Ambulatory Visit: Payer: Self-pay

## 2022-04-28 MED ORDER — TIRZEPATIDE 7.5 MG/0.5ML ~~LOC~~ SOAJ: 7.5000 mg | SUBCUTANEOUS | 1 refills | Status: DC

## 2022-05-22 ENCOUNTER — Other Ambulatory Visit: Payer: Self-pay

## 2022-05-22 MED ORDER — FREESTYLE LIBRE 3 SENSOR MISC: 1.0000 "application " | 2 refills | Status: DC

## 2022-05-24 ENCOUNTER — Other Ambulatory Visit: Payer: Self-pay | Admitting: *Deleted

## 2022-05-24 DIAGNOSIS — R109 Unspecified abdominal pain: Secondary | ICD-10-CM

## 2022-05-25 MED ORDER — POLYETHYLENE GLYCOL 3350 17 GM/SCOOP PO POWD: 17.0000 g | Freq: Two times a day (BID) | ORAL | 1 refills | Status: DC | PRN

## 2022-06-06 ENCOUNTER — Other Ambulatory Visit: Payer: Self-pay

## 2022-06-06 ENCOUNTER — Encounter: Payer: Self-pay | Admitting: Family Medicine

## 2022-06-06 ENCOUNTER — Ambulatory Visit (INDEPENDENT_AMBULATORY_CARE_PROVIDER_SITE_OTHER): Payer: BC Managed Care – PPO | Admitting: Family Medicine

## 2022-06-06 VITALS — BP 135/91 | HR 98 | Ht 70.0 in | Wt 258.2 lb

## 2022-06-06 DIAGNOSIS — R252 Cramp and spasm: Secondary | ICD-10-CM | POA: Diagnosis not present

## 2022-06-06 DIAGNOSIS — Z789 Other specified health status: Secondary | ICD-10-CM

## 2022-06-06 DIAGNOSIS — Z794 Long term (current) use of insulin: Secondary | ICD-10-CM

## 2022-06-06 DIAGNOSIS — Z79899 Other long term (current) drug therapy: Secondary | ICD-10-CM

## 2022-06-06 DIAGNOSIS — Z9189 Other specified personal risk factors, not elsewhere classified: Secondary | ICD-10-CM

## 2022-06-06 DIAGNOSIS — E11319 Type 2 diabetes mellitus with unspecified diabetic retinopathy without macular edema: Secondary | ICD-10-CM

## 2022-06-06 DIAGNOSIS — R600 Localized edema: Secondary | ICD-10-CM | POA: Diagnosis not present

## 2022-06-06 MED ORDER — EMTRICITABINE-TENOFOVIR DF 200-300 MG PO TABS: 1.0000 | ORAL_TABLET | Freq: Every day | ORAL | 0 refills | Status: DC

## 2022-06-06 MED ORDER — DULOXETINE HCL 30 MG PO CPEP: 30.0000 mg | ORAL_CAPSULE | Freq: Every day | ORAL | 3 refills | Status: DC

## 2022-06-06 MED ORDER — ESTRADIOL 0.1 MG/24HR TD PTTW: 1.0000 | MEDICATED_PATCH | TRANSDERMAL | 1 refills | Status: DC

## 2022-06-06 MED ORDER — TIRZEPATIDE 7.5 MG/0.5ML ~~LOC~~ SOAJ: 7.5000 mg | SUBCUTANEOUS | 1 refills | Status: DC

## 2022-06-06 NOTE — Patient Instructions (Addendum)
It was wonderful to see you today.  Please bring ALL of your medications with you to every visit.   Today we talked about:  We are doing lab work today to check for anemia, thyroid disorders, liver and kidney dysfunction, electrolytes. We are also checking your estradiol level. I will send you a MyChart message if you have MyChart. Otherwise, I will give you a call for abnormal results or send a letter if everything returned back normal. If you don't hear from me in 2 weeks, please call the office.    I would recommend wearing compression socks. Wean down your Gabapentin. Take it twice a day for 3 days, then once a day for 3 days and then stop. You can start your Cymbalta afterwards.   Lets cut your insulin down to 20 units.  Continue your metformin, mounjaro and farxiga.   Thank you for coming to your visit as scheduled. We have had a large "no-show" problem lately, and this significantly limits our ability to see and care for patients. As a friendly reminder- if you cannot make your appointment please call to cancel. We do have a no show policy for those who do not cancel within 24 hours. Our policy is that if you miss or fail to cancel an appointment within 24 hours, 3 times in a 67-month period, you may be dismissed from our clinic.   Thank you for choosing [facility].   Please call [phone] with any questions about today's appointment.  Please be sure to schedule follow up at the front  desk before you leave today.   Sharion Settler, DO PGY-3 Family Medicine

## 2022-06-06 NOTE — Progress Notes (Signed)
    SUBJECTIVE:   CHIEF COMPLAINT / HPI:   Carl [name] is a 37 y.o. adult who presents to the [facility] clinic today to discuss the following concerns:   Ankle Swelling [name] noticed that both ankles have been swollen for the last 2-3 weeks. This has limited shoe options. 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 Tries to elevate legs but this doesn't seem to help. Not painful. No recent surgeries. Denies orthopnea.    Diabetes, Type 2 - Last A1c 6 - Medications: Lantus 30U, Metformin 750 mg BID, Mounjaro 7.5, Farxiga 10 mg - Compliance: Good - Checking BG at home: Yes, has CBG. Gets low sugars ~60s about 3-4 times weekly.  - Diet: Still eating regularly but appetite is diminished on Mounjaro  - Exercise: Does cardio 2-3 times weekly - Eye exam: Has appointment next month  - Foot exam: Due  - Statin: Not applicable given age  61  PMH / PSH: T2DM, HTN   OBJECTIVE:   BP (!) 135/91   Pulse 98   Ht 5\' 10"  (1.778 m)   Wt 258 lb 3.2 oz (117.1 kg)   SpO2 100%   BMI 37.05 kg/m    General: NAD, pleasant, able to participate in exam Respiratory:  normal effort Extremities: 1+ edema to mid calf b/l Foot exam: No deformities, ulcerations, or other skin breakdown on feet bilaterally.  Sensation significantly decreased to monofilament in both feet.  PT and DP pulses intact BL.  1-2+ b/l pitting edema  Psych: Normal affect and mood  ASSESSMENT/PLAN:    1. Bilateral leg edema Unclear cause. Differential includes anemia, thyroid disorders, liver disorders, medication side effects (gabapentin), venous stasis. Doubt DVT given b/l nature. Encouraged elevation and compressions. Will check labs or additional causes.No orthopnea, less likely result of heart failure.  - Brain natriuretic peptide - CBC - TSH Rfx on Abnormal to Free T4 - Comprehensive metabolic panel - Shared decision making to stop gabapentin.  Discussed that gabapentin can cause swelling, and that that  she is on a low-dose that seems to be her only new medication.  Will transition to Cymbalta for her neuropathy.  2. On pre-exposure prophylaxis for HIV Checking labs today. - HIV antibody (with reflex) - RPR  3. 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 Checking labs. Refilled estradiol.  - Estradiol - estradiol (DOTTI) 0.1 MG/24HR patch; Place 1 patch (0.1 mg total) onto the skin 2 (two) times a week.  Dispense: 8 patch; Refill: 1  4. Leg cramps Unclear cause. Occurring mostly at night. Will check for electrolyte abnormalities.  - Magnesium - CMP  5. At risk for HIV due to bisexual contact - Check HIV, RPR  - Declined other STI checks today  - emtricitabine-tenofovir (TRUVADA) 200-300 MG tablet; Take 1 tablet by mouth daily.  Dispense: 90 tablet; Refill: 0  6. Type 2 diabetes mellitus with retinopathy of both eyes, with long-term current use of insulin, macular edema presence unspecified, unspecified retinopathy severity ([place]) With some hypoglycemic events. Her diabetes is well controlled and she likely could decrease insulin. -Continue metformin, Farxiga, Mounjaro -Decrease insulin to 20 units -Next A1c due in May -Recommended for eye examination -Foot exam today    Sharion Settler, [place]

## 2022-06-07 ENCOUNTER — Other Ambulatory Visit: Payer: Self-pay | Admitting: Family Medicine

## 2022-06-07 DIAGNOSIS — R748 Abnormal levels of other serum enzymes: Secondary | ICD-10-CM

## 2022-06-07 DIAGNOSIS — Z789 Other specified health status: Secondary | ICD-10-CM

## 2022-06-07 LAB — COMPREHENSIVE METABOLIC PANEL
ALT: 86 IU/L — ABNORMAL HIGH (ref 0–44)
AST: 64 IU/L — ABNORMAL HIGH (ref 0–40)
Albumin/Globulin Ratio: 2.2 (ref 1.2–2.2)
Albumin: 4.8 g/dL (ref 4.1–5.1)
Alkaline Phosphatase: 127 IU/L — ABNORMAL HIGH (ref 44–121)
BUN/Creatinine Ratio: 14 (ref 9–20)
BUN: 13 mg/dL (ref 6–20)
Bilirubin Total: 0.4 mg/dL (ref 0.0–1.2)
CO2: 24 mmol/L (ref 20–29)
Calcium: 9.9 mg/dL (ref 8.7–10.2)
Chloride: 102 mmol/L (ref 96–106)
Creatinine, Ser: 0.94 mg/dL (ref 0.76–1.27)
Globulin, Total: 2.2 g/dL (ref 1.5–4.5)
Glucose: 91 mg/dL (ref 70–99)
Potassium: 4.8 mmol/L (ref 3.5–5.2)
Sodium: 142 mmol/L (ref 134–144)
Total Protein: 7 g/dL (ref 6.0–8.5)
eGFR: 109 mL/min/{1.73_m2} (ref >59)

## 2022-06-07 LAB — MAGNESIUM: Magnesium: 2.1 mg/dL (ref 1.6–2.3)

## 2022-06-07 LAB — CBC
Hematocrit: 44.2 % (ref 37.5–51.0)
Hemoglobin: 15.2 g/dL (ref 13.0–17.7)
MCH: 29.3 pg (ref 26.6–33.0)
MCHC: 34.4 g/dL (ref 31.5–35.7)
MCV: 85 fL (ref 79–97)
Platelets: 360 10*3/uL (ref 150–450)
RBC: 5.18 x10E6/uL (ref 4.14–5.80)
RDW: 13.1 % (ref 11.6–15.4)
WBC: 9.6 10*3/uL (ref 3.4–10.8)

## 2022-06-07 LAB — TSH RFX ON ABNORMAL TO FREE T4: TSH: 1.85 u[IU]/mL (ref 0.450–4.500)

## 2022-06-07 LAB — HIV ANTIBODY (ROUTINE TESTING W REFLEX): HIV Screen 4th Generation wRfx: NONREACTIVE

## 2022-06-07 LAB — ESTRADIOL: Estradiol: 39.5 pg/mL (ref 7.6–42.6)

## 2022-06-07 LAB — BRAIN NATRIURETIC PEPTIDE: BNP: <2.5 pg/mL (ref 0.0–100.0)

## 2022-06-07 LAB — RPR: RPR Ser Ql: NONREACTIVE

## 2022-06-07 MED ORDER — SPIRONOLACTONE 25 MG PO TABS: 25.0000 mg | ORAL_TABLET | Freq: Every day | ORAL | 2 refills | Status: DC

## 2022-06-13 ENCOUNTER — Encounter: Payer: Self-pay | Admitting: Family Medicine

## 2022-06-14 ENCOUNTER — Telehealth: Payer: Self-pay

## 2022-06-14 NOTE — Telephone Encounter (Signed)
Patient calls nurse line regarding Mounjaro needing PA.   [employer] does not use cover my meds for PA anymore. Received message that PA would need to be submitted through prompt pa. Attempted to submit through this portal. Received error message that they were unable to find patient.   Will attempt to complete PA again tomorrow through portal.   Talbot Grumbling, RN

## 2022-06-15 NOTE — Telephone Encounter (Signed)
PA submitted through prompt pa.  Member number for PA :[id number] [place] number: [phone]  Will check status later today.   Talbot Grumbling, RN

## 2022-06-15 NOTE — Telephone Encounter (Signed)
Medication approved through patient's insurance.   Updated patient via mychart.   Talbot Grumbling, RN

## 2022-07-11 ENCOUNTER — Telehealth: Payer: BC Managed Care – PPO | Admitting: Physician Assistant

## 2022-07-11 DIAGNOSIS — R112 Nausea with vomiting, unspecified: Secondary | ICD-10-CM

## 2022-07-11 MED ORDER — ONDANSETRON 4 MG PO TBDP: 4.0000 mg | ORAL_TABLET | Freq: Three times a day (TID) | ORAL | 0 refills | Status: DC | PRN

## 2022-07-11 NOTE — Progress Notes (Signed)
I have spent 5 minutes in review of e-visit questionnaire, review and updating patient chart, medical decision making and response to patient.   Mael Delap Cody Roslynn Holte, PA-C    

## 2022-07-11 NOTE — Progress Notes (Signed)
E-Visit for Nausea and Vomiting   We are sorry that you are not feeling well. Here is how we plan to help!  Based on what you have shared with me it looks like you have nausea/vomiting secondary to a migraine headache.  Although nausea and vomiting can make you feel miserable, it's important to remember that these are not diseases, but rather symptoms of an underlying illness.  When we treat short term symptoms, we always caution that any symptoms that persist should be fully evaluated in a medical office.  I have prescribed a medication that will help alleviate your symptoms and allow you to stay hydrated:  Zofran 4 mg 1 tablet every 8 hours as needed for nausea and vomiting  HOME CARE: Drink clear liquids.  This is very important! Dehydration (the lack of fluid) can lead to a serious complication.  Start off with 1 tablespoon every 5 minutes for 8 hours. You may begin eating bland foods after 8 hours without vomiting.  Start with saltine crackers, white bread, rice, mashed potatoes, applesauce. After 48 hours on a bland diet, you may resume a normal diet. Try to go to sleep.  Sleep often empties the stomach and relieves the need to vomit.  GET HELP RIGHT AWAY IF:  Your symptoms do not improve or worsen within 2 days after treatment. You have a fever for over 3 days. You cannot keep down fluids after trying the medication.  MAKE SURE YOU:  Understand these instructions. Will watch your condition. Will get help right away if you are not doing well or get worse.    Thank you for choosing an e-visit.  Your e-visit answers were reviewed by a board certified advanced clinical practitioner to complete your personal care plan. Depending upon the condition, your plan could have included both over the counter or prescription medications.  Please review your pharmacy choice. Make sure the pharmacy is open so you can pick up prescription now. If there is a problem, you may contact your  provider through [employer] and have the prescription routed to another pharmacy.  Your safety is important to [place]. If you have drug allergies check your prescription carefully.   For the next 24 hours you can use MyChart to ask questions about today's visit, request a non-urgent call back, or ask for a work or school excuse. You will get an email in the next two days asking about your experience. I hope that your e-visit has been valuable and will speed your recovery.

## 2022-08-05 ENCOUNTER — Other Ambulatory Visit: Payer: Self-pay | Admitting: Family Medicine

## 2022-08-05 DIAGNOSIS — Z789 Other specified health status: Secondary | ICD-10-CM

## 2022-08-16 ENCOUNTER — Encounter: Payer: Self-pay | Admitting: Family Medicine

## 2022-08-16 DIAGNOSIS — E11319 Type 2 diabetes mellitus with unspecified diabetic retinopathy without macular edema: Secondary | ICD-10-CM

## 2022-08-17 ENCOUNTER — Other Ambulatory Visit (HOSPITAL_COMMUNITY): Payer: Self-pay

## 2022-08-17 MED ORDER — MOUNJARO 2.5 MG/0.5ML ~~LOC~~ SOAJ
2.5000 mg | SUBCUTANEOUS | 1 refills | Status: DC
  Filled 2022-08-17: qty 2, 28d supply, fill #0

## 2022-08-17 MED ORDER — MOUNJARO 5 MG/0.5ML ~~LOC~~ SOAJ
5.0000 mg | SUBCUTANEOUS | 1 refills | Status: DC
  Filled 2022-08-17: qty 2, 28d supply, fill #0

## 2022-09-04 ENCOUNTER — Other Ambulatory Visit: Payer: Self-pay | Admitting: Family Medicine

## 2022-09-04 DIAGNOSIS — Z789 Other specified health status: Secondary | ICD-10-CM

## 2022-10-04 ENCOUNTER — Other Ambulatory Visit: Payer: Self-pay | Admitting: *Deleted

## 2022-10-04 DIAGNOSIS — E1165 Type 2 diabetes mellitus with hyperglycemia: Secondary | ICD-10-CM

## 2022-10-04 MED ORDER — INSULIN GLARGINE 100 UNIT/ML SOLOSTAR PEN: 30.0000 [IU] | PEN_INJECTOR | Freq: Every day | SUBCUTANEOUS | 3 refills | Status: DC

## 2022-10-12 NOTE — Progress Notes (Addendum)
SUBJECTIVE:   CHIEF COMPLAINT: labs and edema HPI:   Carl [name] is a 37 y.o.  with history notable for type 2 DM and HTN presenting for follow up .   LE Edema Reported in January. She reports months of R>L LE edema. This worse at the end of the day. She sits most days. Starts in ankles but does come up rest of the leg. Denies dyspnea, CP. Does have intermittent L calf pain at night. She does take estradiol.   Transgender Care  Reports she feels great. Estradiol patches are working and sticking but she forgets to change them. Denies adverse effects.  Intermittent nausea  Has had one bout of her 'vomiting' since winter. Feels sometimes food does not pass easily in esophagus. Occurs with liquids and solids. Has had EGD in the past. Has a history of gastroparesis. Does notice symptoms worsen with gluten. Of note, she was avoiding gluten when she had biopsies taken. No hematemesis, odynophagia, or change in stomach symptoms since that time.   Type 2 DM Reports compliance with insulin, GLP and SGLT2. She reports she did have a spike in her sugars when she was off Mounjaro. She is also only on 7.5 mg right now. No adverse effects. Does not think nausea episodes are related to this. BG in AM 100-120. Denies lows. She is not on a statin due to age.   PERTINENT  PMH / PSH/Family/Social History : updated  OBJECTIVE:   BP 138/88   Pulse (!) 102   Ht 5\' 10"  (1.778 m)   Wt 267 lb (121.1 kg)   SpO2 100%   BMI 38.31 kg/m   Today's weight:  Last Weight  Most recent update: 10/13/2022  8:37 AM    Weight  121.1 kg (267 lb)            Review of prior weights: [employer]   10/13/22 0836  Weight: 267 lb (121.1 kg)     Cardiac: Regular rate and rhythm. Normal S1/S2. No murmurs, rubs, or gallops appreciated. Lungs: Clear bilaterally to ascultation.  Abdomen: Normoactive bowel sounds. No tenderness to deep or light palpation. No rebound or guarding. No epigastric pain   Psych: Pleasant  and appropriate  Ext: ++ R ankle edema with trace edema to mid shin 1+ on L with mild edema  2014 RUQ ultrasound  IMPRESSION:  1. There is no acute abnormality of the gallbladder or common bile duct.  The  pancreas is nonvisualized.  2. There is borderline enlargement of the liver with somewhat increased  echotexture. These are nonspecific findings. Correlation with patient's  clinical and laboratory values is needed.     ASSESSMENT/PLAN:   Essential hypertension At goal, consider discontinuing CCB and increasing spironolactone for edema   Diabetes (HCC) A1C near goal, increase mounjaro to 10 mg.  Not on a statin due to age.  UACR at next visit--already on ARB and SGLT2   On pre-exposure prophylaxis for HIV Routine labs today Will refill prep pending results tomorrow   Prefers Male Pronouns--- Carl [name]  Monitoring labs today Will reach out to [place] to see if any weekly patches covered   LE Edema Previously had reassuring labs--repeating LFT today Given she is on estradiol must consider VTE---D-dimer stat If persists will obtain echocardiogram given family history Suspect this is actually due to estradiol+ amlodipine Plan as above for this if labs negative  Ferritin for cramping  Nausea and vomiting ? Gastroparesis If esophageal symptoms persist, may benefit from manometry  Celiac testing today   Elevated ALT/AST If persistently elevated, will obtain RUQ ultrasound  Suspect MAFLD   Difficulty with housing, transport and at time food insecurity Noted on Mychart message Discussed---she is interested in discussing with SW and agrees to referral   At next visit consider further discussion of fatigue (Saturdays she is very sleepy)  Also needs UACR in July     Terisa Starr, MD  Family Medicine Teaching Service  [facility]

## 2022-10-13 ENCOUNTER — Ambulatory Visit: Payer: BC Managed Care – PPO | Admitting: Family Medicine

## 2022-10-13 ENCOUNTER — Encounter: Payer: Self-pay | Admitting: Family Medicine

## 2022-10-13 VITALS — BP 138/88 | HR 102 | Ht 70.0 in | Wt 267.0 lb

## 2022-10-13 DIAGNOSIS — I10 Essential (primary) hypertension: Secondary | ICD-10-CM | POA: Diagnosis not present

## 2022-10-13 DIAGNOSIS — Z79899 Other long term (current) drug therapy: Secondary | ICD-10-CM

## 2022-10-13 DIAGNOSIS — Z113 Encounter for screening for infections with a predominantly sexual mode of transmission: Secondary | ICD-10-CM | POA: Diagnosis not present

## 2022-10-13 DIAGNOSIS — R1115 Cyclical vomiting syndrome unrelated to migraine: Secondary | ICD-10-CM

## 2022-10-13 DIAGNOSIS — E11319 Type 2 diabetes mellitus with unspecified diabetic retinopathy without macular edema: Secondary | ICD-10-CM

## 2022-10-13 DIAGNOSIS — Z789 Other specified health status: Secondary | ICD-10-CM

## 2022-10-13 DIAGNOSIS — Z794 Long term (current) use of insulin: Secondary | ICD-10-CM

## 2022-10-13 DIAGNOSIS — R600 Localized edema: Secondary | ICD-10-CM

## 2022-10-13 DIAGNOSIS — Z59819 Housing instability, housed unspecified: Secondary | ICD-10-CM

## 2022-10-13 LAB — D-DIMER, QUANTITATIVE: D-DIMER: <0.2 mg/L FEU (ref 0.00–0.49)

## 2022-10-13 LAB — POCT GLYCOSYLATED HEMOGLOBIN (HGB A1C): HbA1c, POC (controlled diabetic range): 7.3 % — AB (ref 0.0–7.0)

## 2022-10-13 NOTE — Addendum Note (Signed)
Addended by: Manson Passey, Dakisha Schoof on: 10/13/2022 10:05 AM   Modules accepted: Orders

## 2022-10-13 NOTE — Assessment & Plan Note (Signed)
A1C near goal, increase mounjaro to 10 mg.  Not on a statin due to age.  UACR at next visit--already on ARB and SGLT2

## 2022-10-13 NOTE — Assessment & Plan Note (Signed)
Routine labs today Will refill prep pending results tomorrow

## 2022-10-13 NOTE — Assessment & Plan Note (Signed)
Monitoring labs today Will reach out to [facility] to see if any weekly patches covered

## 2022-10-13 NOTE — Assessment & Plan Note (Signed)
At goal, consider discontinuing CCB and increasing spironolactone for edema

## 2022-10-13 NOTE — Patient Instructions (Addendum)
It was wonderful to see you today.  Please bring ALL of your medications with you to every visit.   Today we talked about:  - Please schedule a follow up with discuss your fatigue---I think we should consider a sleep study  - I will send in your Truvada tomorrow  - I will message you with labs or call if anything is urgent   You will be called by a Child psychotherapist for assistance  For your edema - I think this may be due to medications - pending your blood work we will increase your spironolactone to help  We will test for Celiac since you are back on gluten  [place] job with your health!!     I will check with our Pharmacy Tech about a weekly patch instead of a twice weekly   Please follow up in 3 months   Thank you for choosing [facility].   Please call [phone] with any questions about today's appointment.  Please be sure to schedule follow up at the front  desk before you leave today.   Terisa Starr, MD  Family Medicine

## 2022-10-14 ENCOUNTER — Other Ambulatory Visit: Payer: Self-pay | Admitting: Family Medicine

## 2022-10-14 DIAGNOSIS — Z9189 Other specified personal risk factors, not elsewhere classified: Secondary | ICD-10-CM

## 2022-10-14 LAB — HEPATIC FUNCTION PANEL
AST: 34 IU/L (ref 0–40)
Bilirubin Total: 0.3 mg/dL (ref 0.0–1.2)

## 2022-10-14 LAB — HEPATITIS A ANTIBODY, TOTAL: hep A Total Ab: NEGATIVE

## 2022-10-14 LAB — HEPATITIS B SURFACE ANTIGEN: Hepatitis B Surface Ag: NEGATIVE

## 2022-10-14 LAB — CELIAC DISEASE AB SCREEN W/RFX

## 2022-10-14 LAB — HCV AB W REFLEX TO QUANT PCR: HCV Ab: NONREACTIVE

## 2022-10-14 MED ORDER — EMTRICITABINE-TENOFOVIR DF 200-300 MG PO TABS
1.0000 | ORAL_TABLET | Freq: Every day | ORAL | 0 refills | Status: DC
Start: 2022-10-14 — End: 2023-06-05

## 2022-10-16 ENCOUNTER — Encounter: Payer: Self-pay | Admitting: Family Medicine

## 2022-10-16 DIAGNOSIS — Z789 Other specified health status: Secondary | ICD-10-CM

## 2022-10-16 DIAGNOSIS — E11319 Type 2 diabetes mellitus with unspecified diabetic retinopathy without macular edema: Secondary | ICD-10-CM

## 2022-10-16 LAB — HEPATITIS B SURFACE ANTIBODY, QUANTITATIVE: Hepatitis B Surf Ab Quant: 61.3 m[IU]/mL (ref >9.9)

## 2022-10-16 LAB — HEPATIC FUNCTION PANEL
ALT: 64 IU/L — ABNORMAL HIGH (ref 0–44)
Albumin: 4.5 g/dL (ref 4.1–5.1)
Alkaline Phosphatase: 133 IU/L — ABNORMAL HIGH (ref 44–121)
Bilirubin, Direct: <0.1 mg/dL (ref 0.00–0.40)
Total Protein: 6.8 g/dL (ref 6.0–8.5)

## 2022-10-16 LAB — HCV INTERPRETATION

## 2022-10-16 LAB — HEPATITIS B CORE ANTIBODY, TOTAL: Hep B Core Total Ab: NEGATIVE

## 2022-10-16 LAB — D-DIMER, QUANTITATIVE

## 2022-10-16 LAB — CELIAC DISEASE AB SCREEN W/RFX
Antigliadin Abs, IgA: 15 units (ref 0–19)
IgA/Immunoglobulin A, Serum: 93 mg/dL (ref 90–386)

## 2022-10-16 LAB — FERRITIN: Ferritin: 93 ng/mL (ref 30–400)

## 2022-10-16 LAB — RPR: RPR Ser Ql: NONREACTIVE

## 2022-10-16 LAB — HIV ANTIBODY (ROUTINE TESTING W REFLEX): HIV Screen 4th Generation wRfx: NONREACTIVE

## 2022-10-16 MED ORDER — SPIRONOLACTONE 25 MG PO TABS: 25.0000 mg | ORAL_TABLET | Freq: Two times a day (BID) | ORAL | 2 refills | Status: DC

## 2022-10-16 MED ORDER — MOUNJARO 10 MG/0.5ML ~~LOC~~ SOAJ
10.0000 mg | SUBCUTANEOUS | 1 refills | Status: DC
Start: 2022-10-16 — End: 2022-12-13

## 2022-10-24 ENCOUNTER — Other Ambulatory Visit: Payer: BC Managed Care – PPO

## 2022-10-24 ENCOUNTER — Telehealth: Payer: Self-pay | Admitting: Family Medicine

## 2022-10-24 DIAGNOSIS — R748 Abnormal levels of other serum enzymes: Secondary | ICD-10-CM

## 2022-10-24 MED ORDER — ESTRADIOL 0.1 MG/24HR TD PTWK: 0.1000 mg | MEDICATED_PATCH | TRANSDERMAL | 12 refills | Status: DC

## 2022-10-24 NOTE — Telephone Encounter (Signed)
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 Please call patient to discuss.   The best call back is [phone]

## 2022-10-24 NOTE — Telephone Encounter (Signed)
Called patient multiple times it goes straight to VM, left VM informing patient rx has been sent and to let us know the price of the medication. Penni Bombard CMA

## 2022-10-24 NOTE — Telephone Encounter (Signed)
Tried to call patient but no answer. I will try again later. Penni Bombard CMA

## 2022-10-24 NOTE — Telephone Encounter (Signed)
Nursing- Please call patient and let him know I have not yet heard back from pharmacy team.  I have sent in Climara which is once a week as a test prescription. We will see the cost of this medication. Please have her let us know know the cost of this.  Terisa Starr, MD  Family Medicine Teaching Service

## 2022-10-24 NOTE — Addendum Note (Signed)
Addended by: Manson Passey, Cathalina Barcia on: 10/24/2022 09:40 AM   Modules accepted: Orders

## 2022-10-25 LAB — HEPATITIS B SURFACE ANTIGEN: Hepatitis B Surface Ag: NEGATIVE

## 2022-10-25 LAB — COMPREHENSIVE METABOLIC PANEL
ALT: 49 IU/L — ABNORMAL HIGH (ref 0–44)
AST: 38 IU/L (ref 0–40)
Albumin: 4.4 g/dL (ref 4.1–5.1)
Alkaline Phosphatase: 138 IU/L — ABNORMAL HIGH (ref 44–121)
BUN/Creatinine Ratio: 19 (ref 9–20)
BUN: 19 mg/dL (ref 6–20)
Bilirubin Total: 0.4 mg/dL (ref 0.0–1.2)
CO2: 22 mmol/L (ref 20–29)
Calcium: 10.2 mg/dL (ref 8.7–10.2)
Chloride: 100 mmol/L (ref 96–106)
Creatinine, Ser: 1.02 mg/dL (ref 0.76–1.27)
Globulin, Total: 2.1 g/dL (ref 1.5–4.5)
Glucose: 135 mg/dL — ABNORMAL HIGH (ref 70–99)
Potassium: 5.1 mmol/L (ref 3.5–5.2)
Sodium: 140 mmol/L (ref 134–144)
Total Protein: 6.5 g/dL (ref 6.0–8.5)
eGFR: 98 mL/min/{1.73_m2} (ref >59)

## 2022-10-25 LAB — HCV INTERPRETATION

## 2022-10-25 LAB — HCV AB W REFLEX TO QUANT PCR: HCV Ab: NONREACTIVE

## 2022-12-11 ENCOUNTER — Other Ambulatory Visit: Payer: Self-pay

## 2022-12-13 ENCOUNTER — Other Ambulatory Visit: Payer: Self-pay | Admitting: *Deleted

## 2022-12-13 DIAGNOSIS — E11319 Type 2 diabetes mellitus with unspecified diabetic retinopathy without macular edema: Secondary | ICD-10-CM

## 2022-12-13 MED ORDER — MOUNJARO 10 MG/0.5ML ~~LOC~~ SOAJ
10.0000 mg | SUBCUTANEOUS | 1 refills | Status: DC
Start: 2022-12-13 — End: 2022-12-22

## 2022-12-22 ENCOUNTER — Encounter: Payer: Self-pay | Admitting: Family Medicine

## 2022-12-22 ENCOUNTER — Other Ambulatory Visit (HOSPITAL_COMMUNITY): Payer: Self-pay

## 2022-12-22 DIAGNOSIS — E11319 Type 2 diabetes mellitus with unspecified diabetic retinopathy without macular edema: Secondary | ICD-10-CM

## 2022-12-22 MED ORDER — MOUNJARO 10 MG/0.5ML ~~LOC~~ SOAJ
10.0000 mg | SUBCUTANEOUS | 1 refills | Status: DC
Start: 2022-12-22 — End: 2022-12-22
  Filled 2022-12-22: qty 2, 28d supply, fill #0

## 2022-12-22 MED ORDER — SEMAGLUTIDE(0.25 OR 0.5MG/DOS) 2 MG/1.5ML ~~LOC~~ SOPN: 0.2500 mg | PEN_INJECTOR | SUBCUTANEOUS | 1 refills | Status: DC

## 2022-12-22 NOTE — Addendum Note (Signed)
Addended by: Manson Passey, Ajit Errico on: 12/22/2022 02:30 PM   Modules accepted: Orders

## 2022-12-28 ENCOUNTER — Encounter: Payer: Self-pay | Admitting: Family Medicine

## 2023-01-01 ENCOUNTER — Ambulatory Visit: Payer: BC Managed Care – PPO | Admitting: Family Medicine

## 2023-01-06 ENCOUNTER — Encounter (INDEPENDENT_AMBULATORY_CARE_PROVIDER_SITE_OTHER): Payer: Self-pay

## 2023-01-09 ENCOUNTER — Other Ambulatory Visit: Payer: Self-pay | Admitting: Medical Genetics

## 2023-01-09 DIAGNOSIS — Z006 Encounter for examination for normal comparison and control in clinical research program: Secondary | ICD-10-CM

## 2023-01-17 ENCOUNTER — Encounter: Payer: Self-pay | Admitting: Family Medicine

## 2023-01-17 ENCOUNTER — Ambulatory Visit: Payer: BC Managed Care – PPO | Admitting: Family Medicine

## 2023-01-17 ENCOUNTER — Ambulatory Visit (INDEPENDENT_AMBULATORY_CARE_PROVIDER_SITE_OTHER): Payer: BC Managed Care – PPO | Admitting: Family Medicine

## 2023-01-17 DIAGNOSIS — E11319 Type 2 diabetes mellitus with unspecified diabetic retinopathy without macular edema: Secondary | ICD-10-CM

## 2023-01-17 DIAGNOSIS — R109 Unspecified abdominal pain: Secondary | ICD-10-CM

## 2023-01-17 DIAGNOSIS — Z79899 Other long term (current) drug therapy: Secondary | ICD-10-CM | POA: Diagnosis not present

## 2023-01-17 DIAGNOSIS — Z789 Other specified health status: Secondary | ICD-10-CM | POA: Diagnosis not present

## 2023-01-17 DIAGNOSIS — G8929 Other chronic pain: Secondary | ICD-10-CM

## 2023-01-17 DIAGNOSIS — E1165 Type 2 diabetes mellitus with hyperglycemia: Secondary | ICD-10-CM | POA: Diagnosis not present

## 2023-01-17 DIAGNOSIS — I10 Essential (primary) hypertension: Secondary | ICD-10-CM | POA: Diagnosis not present

## 2023-01-17 DIAGNOSIS — G4719 Other hypersomnia: Secondary | ICD-10-CM

## 2023-01-17 DIAGNOSIS — Z794 Long term (current) use of insulin: Secondary | ICD-10-CM

## 2023-01-17 MED ORDER — SEMAGLUTIDE (1 MG/DOSE) 4 MG/3ML ~~LOC~~ SOPN: 1.0000 mg | PEN_INJECTOR | SUBCUTANEOUS | 2 refills | Status: DC

## 2023-01-17 MED ORDER — INSULIN GLARGINE 100 UNIT/ML SOLOSTAR PEN
12.0000 [IU] | PEN_INJECTOR | Freq: Every day | SUBCUTANEOUS | 3 refills | Status: DC
Start: 2023-01-17 — End: 2023-10-05

## 2023-01-17 NOTE — Progress Notes (Signed)
[facility] Telemedicine Visit  Patient consented to have virtual visit and was identified by name and date of birth. Method of visit: Telephone  Encounter participants: Patient: Carl [name] - located at work in private area Provider: Westley Chandler - located at office Others (if applicable): NA  Chief Complaint: check BG, medications, school check  HPI:  Carl [name] is a pleasant 37 yo presenting for follow up.    Type 2 DM Reports her BG have been great. AM average 110. Sometimes a bit higher. Denies lows taking medications as prescribed.  On metformin, SGLT2 and 0.5 mg Ozempic. Takes Lantus 15 units daily right now.    HTN BP checked regularly. Averages 120/83. No HA, CP, Dyspnea. Amlodipine recently stopped due to edema. She is liking increased spironolactone.   Hormone Therapy  Pleased with changes. Climara patches working well. No adverse effects. Patches stick well.   She reports her edema is better on the increased dose of spironoalctone and discontinuing amlodipine.  She reports excessive daytime sleepiness. No severe headaches. No sedating medications. Has good schedule. Avoid excess caffeine at night. Not sure if she snores. Does have severe daytime sleepiness.    She has had a history of stomach issues. Has seen GI in past, possible gastroparesis. Symptoms much improved on IBS Guard which is OTC. She is curious if she could have exocrine pancreas insufficiency.   ROS: per HPI  Pertinent PMHx:    Exam:  Pleasant and speaking in full sentences  Assessment & Plan Type 2 diabetes mellitus with hyperglycemia, without long-term current use of insulin (HCC) Ordered UACR and A1C  Increase Ozempic to 1 mg weekly Decrease insulin to 12 units and message if low glucose She will go down to 10 units if <100  [name] will message if at 10 units still low Ideally would get off of insulin  On pre-exposure prophylaxis for HIV Repeat labs  today Declines GC/CT testing, no new partners  Essential hypertension At goal, BMP today Prefers Male Pronouns--- Carl [name]  Doing well on hormone therapy No adverse effects  Monitoring labs ordered Excessive daytime sleepiness Considered thyroid dysfunction, medication side effect, OSA Referral for sleep study  TSH today  Chronic abdominal pain Will send information and see if Dr. Orvan Falconer has replacement that could follow up on this  Less likely given no signs of malabsorptive process   Time spent during visit with patient: 11 minutes

## 2023-01-17 NOTE — Assessment & Plan Note (Signed)
Doing well on hormone therapy No adverse effects  Monitoring labs ordered

## 2023-01-17 NOTE — Assessment & Plan Note (Signed)
Repeat labs today Declines GC/CT testing, no new partners

## 2023-01-17 NOTE — Assessment & Plan Note (Signed)
Ordered UACR and A1C  Increase Ozempic to 1 mg weekly Decrease insulin to 12 units and message if low glucose She will go down to 10 units if <100  [name] will message if at 10 units still low Ideally would get off of insulin

## 2023-01-17 NOTE — Patient Instructions (Signed)
It was wonderful to see you today.  Please bring ALL of your medications with you to every visit.   Today we talked about:  - Increase Ozempic to 1 mg  - Decrease Lantus to 12 units  If you have low blood sugar reduce to 10 units of Lantus   I will send your labs to [facility]   For your sleepiness I recommend a sleep study   Please follow up in 3 months   Thank you for choosing [facility].   Please call [phone] with any questions about today's appointment.  Please be sure to schedule follow up at the front  desk before you leave today.   Terisa Starr, MD  Family Medicine

## 2023-01-17 NOTE — Assessment & Plan Note (Signed)
Will send information and see if Dr. Orvan Falconer has replacement that could follow up on this  Less likely given no signs of malabsorptive process

## 2023-01-17 NOTE — Assessment & Plan Note (Signed)
At goal, BMP today.  

## 2023-01-19 ENCOUNTER — Other Ambulatory Visit: Payer: Self-pay | Admitting: *Deleted

## 2023-01-19 DIAGNOSIS — E1165 Type 2 diabetes mellitus with hyperglycemia: Secondary | ICD-10-CM

## 2023-01-23 ENCOUNTER — Other Ambulatory Visit: Payer: Self-pay

## 2023-01-23 DIAGNOSIS — Z789 Other specified health status: Secondary | ICD-10-CM

## 2023-01-23 DIAGNOSIS — I10 Essential (primary) hypertension: Secondary | ICD-10-CM

## 2023-01-23 MED ORDER — SPIRONOLACTONE 25 MG PO TABS
25.0000 mg | ORAL_TABLET | Freq: Two times a day (BID) | ORAL | 2 refills | Status: DC
Start: 2023-01-23 — End: 2023-03-13

## 2023-01-23 MED ORDER — LOSARTAN POTASSIUM 50 MG PO TABS
50.0000 mg | ORAL_TABLET | Freq: Every day | ORAL | 3 refills | Status: DC
Start: 2023-01-23 — End: 2023-06-04

## 2023-02-22 ENCOUNTER — Telehealth: Payer: BC Managed Care – PPO | Admitting: Physician Assistant

## 2023-02-22 DIAGNOSIS — A084 Viral intestinal infection, unspecified: Secondary | ICD-10-CM | POA: Diagnosis not present

## 2023-02-22 NOTE — Progress Notes (Signed)
I have spent 5 minutes in review of e-visit questionnaire, review and updating patient chart, medical decision making and response to patient.   Mia Milan Cody Jacklynn Dehaas, PA-C    

## 2023-02-22 NOTE — Progress Notes (Signed)
E-Visit for Diarrhea  We are sorry that you are not feeling well.  Here is how we plan to help!  Based on what you have shared with me it looks like you have Acute Infectious Diarrhea.  Most cases of acute diarrhea are due to infections with virus and bacteria and are self-limited conditions lasting less than 14 days.  For your symptoms you may take Imodium 2 mg tablets that are over the counter at your local pharmacy. Take two tablet now and then one after each loose stool up to 6 a day.  Antibiotics are not needed for most people with diarrhea.  At this point I would stop the zinc in case it is contributing. Keep hydrated and follow the other instructions above.   HOME CARE We recommend changing your diet to help with your symptoms for the next few days. Drink plenty of fluids that contain water salt and sugar. Sports drinks such as Gatorade may help.  You may try broths, soups, bananas, applesauce, soft breads, mashed potatoes or crackers.  You are considered infectious for as long as the diarrhea continues. Hand washing or use of alcohol based hand sanitizers is recommend. It is best to stay out of work or school until your symptoms stop.   GET HELP RIGHT AWAY If you have dark yellow colored urine or do not pass urine frequently you should drink more fluids.   If your symptoms worsen  If you feel like you are going to pass out (faint) You have a new problem  MAKE SURE YOU  Understand these instructions. Will watch your condition. Will get help right away if you are not doing well or get worse.  Thank you for choosing an e-visit.  Your e-visit answers were reviewed by a board certified advanced clinical practitioner to complete your personal care plan. Depending upon the condition, your plan could have included both over the counter or prescription medications.  Please review your pharmacy choice. Make sure the pharmacy is open so you can pick up prescription now. If there is a  problem, you may contact your provider through [employer] and have the prescription routed to another pharmacy.  Your safety is important to [place]. If you have drug allergies check your prescription carefully.   For the next 24 hours you can use MyChart to ask questions about today's visit, request a non-urgent call back, or ask for a work or school excuse. You will get an email in the next two days asking about your experience. I hope that your e-visit has been valuable and will speed your recovery.

## 2023-02-23 ENCOUNTER — Other Ambulatory Visit: Payer: BC Managed Care – PPO

## 2023-02-27 LAB — HEMOGLOBIN A1C
Est. average glucose Bld gHb Est-mCnc: 283 mg/dL
Hgb A1c MFr Bld: 11.5 % — ABNORMAL HIGH (ref 4.8–5.6)

## 2023-03-01 ENCOUNTER — Other Ambulatory Visit: Payer: Self-pay

## 2023-03-01 MED ORDER — DULOXETINE HCL 30 MG PO CPEP: 30.0000 mg | ORAL_CAPSULE | Freq: Every day | ORAL | 3 refills | Status: DC

## 2023-03-09 ENCOUNTER — Telehealth: Payer: BC Managed Care – PPO | Admitting: Family Medicine

## 2023-03-09 DIAGNOSIS — J069 Acute upper respiratory infection, unspecified: Secondary | ICD-10-CM

## 2023-03-09 MED ORDER — BENZONATATE 200 MG PO CAPS: 200.0000 mg | ORAL_CAPSULE | Freq: Two times a day (BID) | ORAL | 0 refills | Status: DC | PRN

## 2023-03-09 MED ORDER — FLUTICASONE PROPIONATE 50 MCG/ACT NA SUSP: 2.0000 | Freq: Every day | NASAL | 6 refills | Status: AC

## 2023-03-09 NOTE — Progress Notes (Signed)

## 2023-03-13 ENCOUNTER — Other Ambulatory Visit: Payer: Self-pay

## 2023-03-13 DIAGNOSIS — Z789 Other specified health status: Secondary | ICD-10-CM

## 2023-03-13 MED ORDER — DAPAGLIFLOZIN PROPANEDIOL 10 MG PO TABS: 10.0000 mg | ORAL_TABLET | Freq: Every day | ORAL | 3 refills | Status: DC

## 2023-03-13 MED ORDER — DULOXETINE HCL 30 MG PO CPEP: 30.0000 mg | ORAL_CAPSULE | Freq: Every day | ORAL | 3 refills | Status: DC

## 2023-03-13 MED ORDER — SPIRONOLACTONE 25 MG PO TABS: 25.0000 mg | ORAL_TABLET | Freq: Two times a day (BID) | ORAL | 2 refills | Status: DC

## 2023-03-14 ENCOUNTER — Other Ambulatory Visit: Payer: Self-pay

## 2023-03-14 MED ORDER — DAPAGLIFLOZIN PROPANEDIOL 10 MG PO TABS: 10.0000 mg | ORAL_TABLET | Freq: Every day | ORAL | 3 refills | Status: AC

## 2023-03-14 MED ORDER — METFORMIN HCL ER 750 MG PO TB24: 750.0000 mg | ORAL_TABLET | Freq: Two times a day (BID) | ORAL | 3 refills | Status: DC

## 2023-03-14 NOTE — Progress Notes (Addendum)
[facility] Telemedicine Visit  Patient consented to have virtual visit and was identified by name and date of birth. Method of visit: attempted to use   Encounter participants: Patient: JAFETH MCGARY - located at OFFICE Provider: Westley Chandler - located at OFFICE Others (if applicable): NONE  Chief Complaint: diabetes   HPI:  [name] is a pleasant 37 yo presenting for follow up.    Diabetes She reports having improved sugar control. She had food and housing insecurity for 1 month (living out of car). This affected diabetes. . BG range running in low 100s typically. She is taking medications as prescribed --lantus down to 10 units now. Tolerating semaglutide 1 mg without stomach issues (mild reflux). No nausea or vomiting. Now has stable housing and good access to food.  She continues to work at [employer]. Has put school on pause until fall 2025.   Feels good on her hormone therapy. No side effects.   She does report intermittent ankle (R>L) swelling at the end of the day. No CP or dyspnea. No calf pain or edema. Resolves with elevation   ROS: per HPI  Pertinent PMHx: On HRT   Exam:  No BP (will take at work and message) Speaking in full sentences   Assessment/Plan:  Essential hypertension New cuff at work Will send measurements via mychart   Dyslipidemia Lipid panel today Continue atorvastatin   PREP Therapy  Monitoring labs ordered CMA to fax to [employer]   Diabetes (HCC) A1C high due to social stressor  Social situation improved Has access to stable housing and food Increase ozempic to 2 mg Discussed side effects Continue other medications Reduce insulin to 8 units if BG<150  Congratulated on changes  Foot exam at follow up    Chronic abdominal pain Improved on semaglutide she reports    Intermittent edema At higher risk of VTE with estrogen Recommended being seen immediately if edema of leg (this is around ankle  joint only) or throughout day  Suspect dependent edema, limited by telephone today--she will schedule in person follow up   Time spent during visit with patient: 10 minutes

## 2023-03-15 ENCOUNTER — Encounter: Payer: Self-pay | Admitting: Family Medicine

## 2023-03-15 ENCOUNTER — Telehealth: Payer: Self-pay

## 2023-03-15 ENCOUNTER — Telehealth: Payer: BC Managed Care – PPO | Admitting: Family Medicine

## 2023-03-15 DIAGNOSIS — R109 Unspecified abdominal pain: Secondary | ICD-10-CM

## 2023-03-15 DIAGNOSIS — G8929 Other chronic pain: Secondary | ICD-10-CM

## 2023-03-15 DIAGNOSIS — E11319 Type 2 diabetes mellitus with unspecified diabetic retinopathy without macular edema: Secondary | ICD-10-CM | POA: Diagnosis not present

## 2023-03-15 DIAGNOSIS — I10 Essential (primary) hypertension: Secondary | ICD-10-CM | POA: Diagnosis not present

## 2023-03-15 DIAGNOSIS — Z113 Encounter for screening for infections with a predominantly sexual mode of transmission: Secondary | ICD-10-CM

## 2023-03-15 DIAGNOSIS — Z794 Long term (current) use of insulin: Secondary | ICD-10-CM

## 2023-03-15 DIAGNOSIS — E785 Hyperlipidemia, unspecified: Secondary | ICD-10-CM

## 2023-03-15 MED ORDER — SEMAGLUTIDE (2 MG/DOSE) 8 MG/3ML ~~LOC~~ SOPN: 2.0000 mg | PEN_INJECTOR | SUBCUTANEOUS | 3 refills | Status: DC

## 2023-03-15 MED ORDER — FREESTYLE LIBRE 3 SENSOR MISC: 1.0000 | 4 refills | Status: DC

## 2023-03-15 NOTE — Assessment & Plan Note (Signed)
-  Lipid panel today. -Continue atorvastatin.

## 2023-03-15 NOTE — Assessment & Plan Note (Signed)
New cuff at work Will send measurements via [employer]

## 2023-03-15 NOTE — Patient Instructions (Addendum)
It was wonderful to talk with you today.   Please bring ALL of your medications with you to every visit.   Today we talked about --- Please increase semaglutide to 2 mg   - If your sugars are below 150 consistently please reduce Lantus to 8 units    - I sent in your sensors   - great work with your health changes!      Please follow up in 2 months   Thank you for choosing [facility].   Please call [phone] with any questions about today's appointment.  Please be sure to schedule follow up at the front  desk before you leave today.   Terisa Starr, MD  Family Medicine

## 2023-03-15 NOTE — Assessment & Plan Note (Signed)
Monitoring labs ordered CMA to fax to [employer]

## 2023-03-15 NOTE — Telephone Encounter (Signed)
Called patient twice but no answer. LVM to patient informing of the MyChart video visit with Dr. Manson Passey today at 10:50am. Wanted to ensure patient the visit can be done using video or via telephone. Left the office call back number in case patient calls back. Penni Bombard CMA

## 2023-03-15 NOTE — Assessment & Plan Note (Signed)
Improved on semaglutide she reports

## 2023-03-15 NOTE — Assessment & Plan Note (Addendum)
A1C high due to social stressor  Social situation improved Has access to stable housing and food Increase ozempic to 2 mg Discussed side effects Continue other medications Reduce insulin to 8 units if BG<150  Congratulated on changes  Foot exam at follow up

## 2023-03-17 ENCOUNTER — Other Ambulatory Visit: Payer: BC Managed Care – PPO | Attending: Medical Genetics

## 2023-04-17 ENCOUNTER — Encounter: Payer: Self-pay | Admitting: Family Medicine

## 2023-04-17 MED ORDER — MOUNJARO 2.5 MG/0.5ML ~~LOC~~ SOAJ: 2.5000 mg | SUBCUTANEOUS | 0 refills | Status: DC

## 2023-04-25 ENCOUNTER — Other Ambulatory Visit (HOSPITAL_BASED_OUTPATIENT_CLINIC_OR_DEPARTMENT_OTHER): Payer: Self-pay

## 2023-04-30 ENCOUNTER — Other Ambulatory Visit: Payer: Self-pay

## 2023-04-30 DIAGNOSIS — E785 Hyperlipidemia, unspecified: Secondary | ICD-10-CM

## 2023-04-30 MED ORDER — ATORVASTATIN CALCIUM 20 MG PO TABS: 20.0000 mg | ORAL_TABLET | Freq: Every day | ORAL | 3 refills | Status: DC

## 2023-05-01 ENCOUNTER — Encounter: Payer: Self-pay | Admitting: Family Medicine

## 2023-05-01 NOTE — Telephone Encounter (Signed)
 Care team updated and letter sent for eye exam notes.

## 2023-05-21 ENCOUNTER — Encounter: Payer: Self-pay | Admitting: Family Medicine

## 2023-05-22 MED ORDER — MOUNJARO 5 MG/0.5ML ~~LOC~~ SOAJ: 5.0000 mg | SUBCUTANEOUS | 0 refills | Status: DC

## 2023-05-26 ENCOUNTER — Telehealth: Payer: BC Managed Care – PPO | Admitting: Physician Assistant

## 2023-05-26 ENCOUNTER — Encounter: Payer: Self-pay | Admitting: Physician Assistant

## 2023-05-26 DIAGNOSIS — K0889 Other specified disorders of teeth and supporting structures: Secondary | ICD-10-CM

## 2023-05-26 MED ORDER — PENICILLIN V POTASSIUM 500 MG PO TABS: 500.0000 mg | ORAL_TABLET | Freq: Three times a day (TID) | ORAL | 0 refills | Status: AC

## 2023-05-26 NOTE — Progress Notes (Signed)
E-Visit for Dental Pain  We are sorry that you are not feeling well.  Here is how we plan to help!  You are going to take penicillin 3 times a day for 7 days.  Continue using over-the-counter pain relief as needed  It is imperative that you see a dentist within 10 days of this eVisit to determine the cause of the dental pain and be sure it is adequately treated  A toothache or tooth pain is caused when the nerve in the root of a tooth or surrounding a tooth is irritated. Dental (tooth) infection, decay, injury, or loss of a tooth are the most common causes of dental pain. Pain may also occur after an extraction (tooth is pulled out). Pain sometimes originates from other areas and radiates to the jaw, thus appearing to be tooth pain.Bacteria growing inside your mouth can contribute to gum disease and dental decay, both of which can cause pain. A toothache occurs from inflammation of the central portion of the tooth called pulp. The pulp contains nerve endings that are very sensitive to pain. Inflammation to the pulp or pulpitis may be caused by dental cavities, trauma, and infection.    HOME CARE:   For toothaches: Over-the-counter pain medications such as acetaminophen or ibuprofen may be used. Take these as directed on the package while you arrange for a dental appointment. Avoid very cold or hot foods, because they may make the pain worse. You may get relief from biting on a cotton ball soaked in oil of cloves. You can get oil of cloves at most drug stores.  For jaw pain:  Aspirin may be helpful for problems in the joint of the jaw in adults. If pain happens every time you open your mouth widely, the temporomandibular joint (TMJ) may be the source of the pain. Yawning or taking a large bite of food may worsen the pain. An appointment with your doctor or dentist will help you find the cause.     GET HELP RIGHT AWAY IF:  You have a high fever or chills If you have had a recent head or  face injury and develop headache, light headedness, nausea, vomiting, or other symptoms that concern you after an injury to your face or mouth, you could have a more serious injury in addition to your dental injury. A facial rash associated with a toothache: This condition may improve with medication. Contact your doctor for them to decide what is appropriate. Any jaw pain occurring with chest pain: Although jaw pain is most commonly caused by dental disease, it is sometimes referred pain from other areas. People with heart disease, especially people who have had stents placed, people with diabetes, or those who have had heart surgery may have jaw pain as a symptom of heart attack or angina. If your jaw or tooth pain is associated with lightheadedness, sweating, or shortness of breath, you should see a doctor as soon as possible. Trouble swallowing or excessive pain or bleeding from gums: If you have a history of a weakened immune system, diabetes, or steroid use, you may be more susceptible to infections. Infections can often be more severe and extensive or caused by unusual organisms. Dental and gum infections in people with these conditions may require more aggressive treatment. An abscess may need draining or IV antibiotics, for example.  MAKE SURE YOU   Understand these instructions. Will watch your condition. Will get help right away if you are not doing well or get worse.  Thank you for choosing an e-visit.  Your e-visit answers were reviewed by a board certified advanced clinical practitioner to complete your personal care plan. Depending upon the condition, your plan could have included both over the counter or prescription medications.  Please review your pharmacy choice. Make sure the pharmacy is open so you can pick up prescription now. If there is a problem, you may contact your provider through [employer] and have the prescription routed to another pharmacy.  Your safety is  important to [place]. If you have drug allergies check your prescription carefully.   For the next 24 hours you can use MyChart to ask questions about today's visit, request a non-urgent call back, or ask for a work or school excuse. You will get an email in the next two days asking about your experience. I hope that your e-visit has been valuable and will speed your recovery.  I have spent 5 minutes in review of e-visit questionnaire, review and updating patient chart, medical decision making and response to patient.   Kasandra Knudsen Mayers, PA-C

## 2023-06-01 NOTE — Progress Notes (Unsigned)
SUBJECTIVE:   CHIEF COMPLAINT: edema HPI:   Carl [name] is a 38 y.o.  with history notable for HTN, type 2 DM on insulin  presenting for follow up .   She reports she is okay  She is regularly seeing a therapist. Reports 1 month of 'depression' with low mood, change in appetite. Taking Cymbalta. Denies SI/HI. Has a remote history of suicide attempt.   Still getting her masters--now in [employer]. Works in Social worker.   She has been off her estradiol patches. She has not had motivation to use. Still identifies as [name] and she/her pronouns. May restart in future. .  In terms of diabetes her sugars have been high resulting in increased insulin use. Reports since changing from [facility] to Ozempic, BG have been high. Also had worsening GI side effects with Ozempic and high BG not present with Mounjaro. .  Does note eating pattern changed with mood.  HTN  BP at work  with high systolics. Taking Losartan as prescribed. No HA, CP, vision changes.    Current Outpatient Medications:    atorvastatin (LIPITOR) 20 MG tablet, Take 1 tablet (20 mg total) by mouth at bedtime., Disp: 90 tablet, Rfl: 3   DULoxetine (CYMBALTA) 30 MG capsule, Take 1 capsule (30 mg total) by mouth daily., Disp: 90 capsule, Rfl: 3   losartan-hydrochlorothiazide (HYZAAR) 50-12.5 MG tablet, Take 1 tablet by mouth daily., Disp: 90 tablet, Rfl: 0   metFORMIN (GLUCOPHAGE-XR) 750 MG 24 hr tablet, Take 1 tablet (750 mg total) by mouth 2 (two) times daily with a meal., Disp: 180 tablet, Rfl: 3   spironolactone (ALDACTONE) 25 MG tablet, Take 1 tablet (25 mg total) by mouth 2 (two) times daily., Disp: 60 tablet, Rfl: 2   tirzepatide (MOUNJARO) 5 MG/0.5ML Pen, Inject 5 mg into the skin once a week., Disp: 2 mL, Rfl: 0   albuterol (VENTOLIN HFA) 108 (90 Base) MCG/ACT inhaler, Inhale 2 puffs into the lungs every 6 (six) hours as needed for wheezing or shortness of breath., Disp: 18 g, Rfl: 3   cetirizine (ZYRTEC) 10 MG tablet, Take  10 mg by mouth daily. (Patient not taking: Reported on 06/04/2023), Disp: , Rfl:    Continuous Glucose Sensor (FREESTYLE LIBRE 3 SENSOR) MISC, 1 application  by Does not apply route as directed. Apply one sensor every 14 days, Disp: 2 each, Rfl: 4   dapagliflozin propanediol (FARXIGA) 10 MG TABS tablet, Take 1 tablet (10 mg total) by mouth daily before breakfast. (Patient not taking: Reported on 06/04/2023), Disp: 90 tablet, Rfl: 3   emtricitabine-tenofovir (TRUVADA) 200-300 MG tablet, Take 1 tablet by mouth daily. (Patient not taking: Reported on 06/04/2023), Disp: 90 tablet, Rfl: 0   estradiol (CLIMARA - DOSED IN MG/24 HR) 0.1 mg/24hr patch, Place 1 patch (0.1 mg total) onto the skin once a week. (Patient not taking: Reported on 06/04/2023), Disp: 4 patch, Rfl: 12   fluticasone (FLONASE) 50 MCG/ACT nasal spray, Place 2 sprays into both nostrils daily., Disp: 16 g, Rfl: 6   insulin glargine (LANTUS) 100 UNIT/ML Solostar Pen, Inject 12 Units into the skin daily. (Patient taking differently: Inject 50 Units into the skin daily.), Disp: 15 mL, Rfl: 3   Insulin Pen Needle (NOVOFINE) 30G X 8 MM MISC, Use to inject insulin daily, Disp: 100 each, Rfl: 11   Insulin Pen Needle (ULTICARE SHORT PEN NEEDLES) 31G X 8 MM MISC, Use to inject insulin as directed, Disp: 100 each, Rfl: 3   Multiple Vitamins-Minerals (  MULTIVITAMIN WITH MINERALS) tablet, Take 1 tablet by mouth daily., Disp: , Rfl:    ondansetron (ZOFRAN-ODT) 4 MG disintegrating tablet, Take 1 tablet (4 mg total) by mouth every 8 (eight) hours as needed for nausea or vomiting., Disp: 20 tablet, Rfl: 0   polyethylene glycol powder (GLYCOLAX/MIRALAX) 17 GM/SCOOP powder, Take 17 g by mouth 2 (two) times daily as needed., Disp: 3350 g, Rfl: 1   PERTINENT  PMH / PSH/Family/Social History : reviewed   OBJECTIVE:   BP (!) 162/96   Pulse 93   Ht 5\' 9"  (1.753 m)   Wt 270 lb 3.2 oz (122.6 kg)   SpO2 94%   BMI 39.90 kg/m   Today's weight:  Last Weight  Most  recent update: 06/04/2023  9:45 AM    Weight  122.6 kg (270 lb 3.2 oz)            Review of prior weights:   Cardiac: Regular rate and rhythm. Normal S1/S2. No murmurs, rubs, or gallops appreciated. Lungs: Clear bilaterally to ascultation.  Abdomen: Normoactive bowel sounds. No tenderness to deep or light palpation. No rebound or guarding.   Psych: Pleasant and appropriate  RLE with trace edema, left with none, some varicosities    ASSESSMENT/PLAN:   Assessment & Plan Essential hypertension Not at goal Start combination Losartan hydrochlorothiazide Stop losartan BMP today and in 2 weeks  Prefers Male Pronouns--- [name]  Discussed at length No monitoring labs as not on therapy  On pre-exposure prophylaxis for HIV PREP labs will refill once returned  Type 2 diabetes mellitus with retinopathy of both eyes, with long-term current use of insulin, macular edema presence unspecified, unspecified retinopathy severity (HCC) Discussed at length Messaged Camille about [place] cost (still has some at home)  Increase Mounjaro to 7.5 mg in 4 weeks Discussed titrating down on insulin as we increase Mounjaro A1C near goal--now at 8  Encounter for immunization Flu given  Mood change Has counseling established Also has numerous social stressors, [employer]BC[employer] number given, she will message if she cannot reach them     BMP at follow up   Terisa Starr, MD  Family Medicine Teaching Service  [facility]

## 2023-06-04 ENCOUNTER — Encounter: Payer: Self-pay | Admitting: Family Medicine

## 2023-06-04 ENCOUNTER — Other Ambulatory Visit (HOSPITAL_COMMUNITY): Payer: Self-pay

## 2023-06-04 ENCOUNTER — Ambulatory Visit (INDEPENDENT_AMBULATORY_CARE_PROVIDER_SITE_OTHER): Payer: BC Managed Care – PPO | Admitting: Family Medicine

## 2023-06-04 ENCOUNTER — Telehealth: Payer: Self-pay

## 2023-06-04 VITALS — BP 162/96 | HR 93 | Ht 69.0 in | Wt 270.2 lb

## 2023-06-04 DIAGNOSIS — E11319 Type 2 diabetes mellitus with unspecified diabetic retinopathy without macular edema: Secondary | ICD-10-CM

## 2023-06-04 DIAGNOSIS — Z789 Other specified health status: Secondary | ICD-10-CM

## 2023-06-04 DIAGNOSIS — Z23 Encounter for immunization: Secondary | ICD-10-CM

## 2023-06-04 DIAGNOSIS — Z794 Long term (current) use of insulin: Secondary | ICD-10-CM | POA: Diagnosis not present

## 2023-06-04 DIAGNOSIS — I10 Essential (primary) hypertension: Secondary | ICD-10-CM

## 2023-06-04 DIAGNOSIS — R4586 Emotional lability: Secondary | ICD-10-CM

## 2023-06-04 DIAGNOSIS — Z79899 Other long term (current) drug therapy: Secondary | ICD-10-CM

## 2023-06-04 LAB — POCT GLYCOSYLATED HEMOGLOBIN (HGB A1C): HbA1c, POC (controlled diabetic range): 8 % — AB (ref 0.0–7.0)

## 2023-06-04 MED ORDER — ONDANSETRON 4 MG PO TBDP: 4.0000 mg | ORAL_TABLET | Freq: Three times a day (TID) | ORAL | 0 refills | Status: DC | PRN

## 2023-06-04 MED ORDER — LOSARTAN POTASSIUM-HCTZ 50-12.5 MG PO TABS: 1.0000 | ORAL_TABLET | Freq: Every day | ORAL | 0 refills | Status: DC

## 2023-06-04 NOTE — Patient Instructions (Addendum)
It was wonderful to see you today.  Please bring ALL of your medications with you to every visit.   Today we talked about:  Related to housing/transport  Call [employer]  MESSAGE ME IF Freddie Breech get through  Direct Dial: [phone]   - I will reach out to [place] about the cost of Faxiga  -- Continue your insulin--let me know the cost of the [facility]  I sent in a combination pill  STOP your losartan START HYZAAR--this is combined losartan hydrochlorothiazide  Follow up in 2 weeks for labs I will message you about the blood work  We will check blood work today  Please send along blood pressure values   Please follow up in 2 weeks    Thank you for choosing [facility].   Please call [phone] with any questions about today's appointment.  Please be sure to schedule follow up at the front  desk before you leave today.   Terisa Starr, MD  Family Medicine

## 2023-06-04 NOTE — Assessment & Plan Note (Signed)
Discussed at length No monitoring labs as not on therapy

## 2023-06-04 NOTE — Assessment & Plan Note (Signed)
PREP labs will refill once returned

## 2023-06-04 NOTE — Telephone Encounter (Signed)
Marcelline Deist copay card completed for patient:     Will call into patients pharmacy after 3p (pharmacy currently on lunch).

## 2023-06-04 NOTE — Assessment & Plan Note (Signed)
Discussed at length Messaged Camille about Marcelline Deist cost (still has some at home)  Increase Mounjaro to 7.5 mg in 4 weeks Discussed titrating down on insulin as we increase Mounjaro A1C near goal--now at 8

## 2023-06-04 NOTE — Assessment & Plan Note (Signed)
Not at goal Start combination Losartan hydrochlorothiazide Stop losartan BMP today and in 2 weeks

## 2023-06-04 NOTE — Telephone Encounter (Signed)
-----   Message from Westley Chandler sent at 06/04/2023 10:07 AM EST ----- Hi [name]--are you able to check to see if any discounts on Farxiga or is Jardiance preferred?   Thanks! CB

## 2023-06-05 ENCOUNTER — Encounter: Payer: Self-pay | Admitting: Family Medicine

## 2023-06-05 ENCOUNTER — Other Ambulatory Visit: Payer: Self-pay | Admitting: Family Medicine

## 2023-06-05 DIAGNOSIS — Z9189 Other specified personal risk factors, not elsewhere classified: Secondary | ICD-10-CM

## 2023-06-05 MED ORDER — EMTRICITABINE-TENOFOVIR DF 200-300 MG PO TABS: 1.0000 | ORAL_TABLET | Freq: Every day | ORAL | 0 refills | Status: DC

## 2023-06-06 LAB — COMPREHENSIVE METABOLIC PANEL
ALT: 56 [IU]/L — ABNORMAL HIGH (ref 0–44)
AST: 36 [IU]/L (ref 0–40)
Albumin: 4.3 g/dL (ref 4.1–5.1)
Alkaline Phosphatase: 105 [IU]/L (ref 44–121)
BUN/Creatinine Ratio: 15 (ref 9–20)
BUN: 15 mg/dL (ref 6–20)
Bilirubin Total: 0.2 mg/dL (ref 0.0–1.2)
CO2: 18 mmol/L — ABNORMAL LOW (ref 20–29)
Calcium: 9.9 mg/dL (ref 8.7–10.2)
Chloride: 103 mmol/L (ref 96–106)
Creatinine, Ser: 1.02 mg/dL (ref 0.76–1.27)
Globulin, Total: 2.4 g/dL (ref 1.5–4.5)
Glucose: 208 mg/dL — ABNORMAL HIGH (ref 70–99)
Potassium: 4.3 mmol/L (ref 3.5–5.2)
Sodium: 141 mmol/L (ref 134–144)
Total Protein: 6.7 g/dL (ref 6.0–8.5)
eGFR: 98 mL/min/{1.73_m2} (ref >59)

## 2023-06-06 LAB — CBC
Hematocrit: 43.1 % (ref 37.5–51.0)
Hemoglobin: 14.4 g/dL (ref 13.0–17.7)
MCH: 29.4 pg (ref 26.6–33.0)
MCHC: 33.4 g/dL (ref 31.5–35.7)
MCV: 88 fL (ref 79–97)
Platelets: 353 10*3/uL (ref 150–450)
RBC: 4.9 x10E6/uL (ref 4.14–5.80)
RDW: 12.5 % (ref 11.6–15.4)
WBC: 7.5 10*3/uL (ref 3.4–10.8)

## 2023-06-06 LAB — RPR: RPR Ser Ql: NONREACTIVE

## 2023-06-06 LAB — LIPID PANEL
Chol/HDL Ratio: 5.6 {ratio} — ABNORMAL HIGH (ref 0.0–5.0)
Cholesterol, Total: 190 mg/dL (ref 100–199)
HDL: 34 mg/dL — ABNORMAL LOW (ref >39)
LDL Chol Calc (NIH): 67 mg/dL (ref 0–99)
Triglycerides: 579 mg/dL (ref 0–149)
VLDL Cholesterol Cal: 89 mg/dL — ABNORMAL HIGH (ref 5–40)

## 2023-06-06 LAB — HCV AB W REFLEX TO QUANT PCR: HCV Ab: NONREACTIVE

## 2023-06-06 LAB — TESTOSTERONE: Testosterone: 237 ng/dL — ABNORMAL LOW (ref 264–916)

## 2023-06-06 LAB — MICROALBUMIN / CREATININE URINE RATIO
Creatinine, Urine: 74 mg/dL
Microalb/Creat Ratio: 23 mg/g{creat} (ref 0–29)
Microalbumin, Urine: 17.3 ug/mL

## 2023-06-06 LAB — TSH RFX ON ABNORMAL TO FREE T4: TSH: 2.99 u[IU]/mL (ref 0.450–4.500)

## 2023-06-06 LAB — HEPATITIS B SURFACE ANTIGEN: Hepatitis B Surface Ag: NEGATIVE

## 2023-06-06 LAB — ESTRADIOL: Estradiol: 24.1 pg/mL (ref 7.6–42.6)

## 2023-06-06 LAB — HIV ANTIBODY (ROUTINE TESTING W REFLEX): HIV Screen 4th Generation wRfx: NONREACTIVE

## 2023-06-06 LAB — HCV INTERPRETATION

## 2023-06-18 ENCOUNTER — Ambulatory Visit (INDEPENDENT_AMBULATORY_CARE_PROVIDER_SITE_OTHER): Payer: BC Managed Care – PPO | Admitting: Student

## 2023-06-18 ENCOUNTER — Encounter: Payer: Self-pay | Admitting: Student

## 2023-06-18 VITALS — BP 144/92 | Ht 69.0 in | Wt 264.8 lb

## 2023-06-18 DIAGNOSIS — I10 Essential (primary) hypertension: Secondary | ICD-10-CM | POA: Diagnosis not present

## 2023-06-18 DIAGNOSIS — E785 Hyperlipidemia, unspecified: Secondary | ICD-10-CM | POA: Diagnosis not present

## 2023-06-18 NOTE — Progress Notes (Signed)
    SUBJECTIVE:   CHIEF COMPLAINT / HPI:   Hypertension Follow-up Seen by Dr. Manson Passey about 2 weeks ago and transitioned from losartan 50 mg daily to losartan-HCTZ 50-12.5 mg daily.  She has been feeling well since that time.  No issues with obtaining or taking her medication.  Returns today for blood pressure check.  OBJECTIVE:   BP (!) 144/92   Ht 5\' 9"  (1.753 m)   Wt 264 lb 12.8 oz (120.1 kg)   BMI 39.10 kg/m   Gen: Well appearing and NAD HEENT: normocephalic, atraumatic, EOM grossly intact, oral mucosa moist, neck supple Respiratory: normal respiratory effort GI: non-distended Skin: no rashes, no jaundice Psych: appropriate mood and affect   ASSESSMENT/PLAN:   Assessment & Plan Essential hypertension BP today remains above goal, albeit improved from her last reading.  Still with room to go up.  Will plan to check a chemistry today and increase either the losartan or the HCTZ component of her Hyzaar based upon her potassium level.  Hesitate to push up her ARB if her potassium is on the high-normal side as I would like her to still have room to go up on her spironolactone should she need it for her gender affirming hormone therapy. -CMP -Will either increase to losartan-hydrochlorothiazide 100-12.5 mg or losartan-hydrochlorothiazide 50-25 mg daily pending electrolytes and kidney function - PCP f/u in 2 weeks  Dyslipidemia Per Dr. Theora Gianotti last note, she had plans to repeat a fasting lipid panel given markedly elevated triglycerides on her last panel 2 weeks ago.  She is fasted today. -Fasting lipid panel    J Dorothyann Gibbs, MD [facility]

## 2023-06-18 NOTE — Progress Notes (Deleted)
    SUBJECTIVE:   CHIEF COMPLAINT / HPI:   BP follow up   PERTINENT  PMH / PSH: ***  OBJECTIVE:   BP (!) 154/100   Ht 5\' 9"  (1.753 m)   Wt 264 lb 12.8 oz (120.1 kg)   BMI 39.10 kg/m   ***  ASSESSMENT/PLAN:   No problem-specific Assessment & Plan notes found for this encounter.     Eliezer Mccoy, MD [facility]

## 2023-06-18 NOTE — Patient Instructions (Signed)
 Javan Messing to meet you! I am going to repeat your kidney function and lipid tests today. Depending on your kidney function and electrolyte balance, we will make a change to your BP medication. I will call you tomorrow and let you know what the plan is.   Alexa Andrews, MD

## 2023-06-19 ENCOUNTER — Encounter: Payer: Self-pay | Admitting: Student

## 2023-06-19 LAB — COMPREHENSIVE METABOLIC PANEL
ALT: 48 [IU]/L — ABNORMAL HIGH (ref 0–44)
AST: 25 [IU]/L (ref 0–40)
Albumin: 4.7 g/dL (ref 4.1–5.1)
Alkaline Phosphatase: 125 [IU]/L — ABNORMAL HIGH (ref 44–121)
BUN/Creatinine Ratio: 18 (ref 9–20)
BUN: 20 mg/dL (ref 6–20)
Bilirubin Total: 0.3 mg/dL (ref 0.0–1.2)
CO2: 20 mmol/L (ref 20–29)
Calcium: 9.5 mg/dL (ref 8.7–10.2)
Chloride: 99 mmol/L (ref 96–106)
Creatinine, Ser: 1.1 mg/dL (ref 0.76–1.27)
Globulin, Total: 2.7 g/dL (ref 1.5–4.5)
Glucose: 179 mg/dL — ABNORMAL HIGH (ref 70–99)
Potassium: 4.3 mmol/L (ref 3.5–5.2)
Sodium: 139 mmol/L (ref 134–144)
Total Protein: 7.4 g/dL (ref 6.0–8.5)
eGFR: 90 mL/min/{1.73_m2} (ref >59)

## 2023-06-19 LAB — LIPID PANEL
Chol/HDL Ratio: 5.3 {ratio} — ABNORMAL HIGH (ref 0.0–5.0)
Cholesterol, Total: 176 mg/dL (ref 100–199)
HDL: 33 mg/dL — ABNORMAL LOW (ref >39)
LDL Chol Calc (NIH): 84 mg/dL (ref 0–99)
Triglycerides: 363 mg/dL — ABNORMAL HIGH (ref 0–149)
VLDL Cholesterol Cal: 59 mg/dL — ABNORMAL HIGH (ref 5–40)

## 2023-06-19 MED ORDER — LOSARTAN POTASSIUM-HCTZ 100-12.5 MG PO TABS: 1.0000 | ORAL_TABLET | Freq: Every day | ORAL | 1 refills | Status: DC

## 2023-06-20 NOTE — Assessment & Plan Note (Addendum)
BP today remains above goal, albeit improved from her last reading.  Still with room to go up.  Will plan to check a chemistry today and increase either the losartan or the HCTZ component of her Hyzaar based upon her potassium level.  Hesitate to push up her ARB if her potassium is on the high-normal side as I would like her to still have room to go up on her spironolactone should she need it for her gender affirming hormone therapy. -CMP -Will either increase to losartan-hydrochlorothiazide 100-12.5 mg or losartan-hydrochlorothiazide 50-25 mg daily pending electrolytes and kidney function - PCP f/u in 2 weeks

## 2023-06-20 NOTE — Assessment & Plan Note (Signed)
Per Dr. Theora Gianotti last note, she had plans to repeat a fasting lipid panel given markedly elevated triglycerides on her last panel 2 weeks ago.  She is fasted today. -Fasting lipid panel

## 2023-06-21 MED ORDER — TIRZEPATIDE 7.5 MG/0.5ML ~~LOC~~ SOAJ: 7.5000 mg | SUBCUTANEOUS | 0 refills | Status: DC

## 2023-06-25 ENCOUNTER — Other Ambulatory Visit (HOSPITAL_COMMUNITY): Payer: Self-pay

## 2023-06-27 ENCOUNTER — Telehealth: Payer: Self-pay

## 2023-06-27 NOTE — Telephone Encounter (Signed)
 Pharmacy Patient Advocate Encounter  Received notification from [facility] that Prior Authorization for [facility] has been APPROVED from 06/27/23 to 06/26/24   PA #/Case ID/Reference #: [id number]

## 2023-06-27 NOTE — Telephone Encounter (Signed)
 Pharmacy Patient Advocate Encounter   Received notification from CoverMyMeds that prior authorization for [facility] is required/requested.   The patient is insured through [facility] .   PA required; PA submitted to above mentioned insurance via CoverMyMeds Key/confirmation #/EOC [id number]. Status is pending

## 2023-06-29 ENCOUNTER — Other Ambulatory Visit: Payer: Self-pay

## 2023-06-29 DIAGNOSIS — E1165 Type 2 diabetes mellitus with hyperglycemia: Secondary | ICD-10-CM

## 2023-06-29 DIAGNOSIS — Z789 Other specified health status: Secondary | ICD-10-CM

## 2023-06-29 MED ORDER — SPIRONOLACTONE 25 MG PO TABS: 25.0000 mg | ORAL_TABLET | Freq: Two times a day (BID) | ORAL | 2 refills | Status: DC

## 2023-06-29 MED ORDER — INSULIN PEN NEEDLE 30G X 8 MM MISC: 11 refills | Status: AC

## 2023-07-03 NOTE — Telephone Encounter (Signed)
 Received call from pharmacy.   Pharmacist reports the medication is still being rejected by insurance and not going through.   The medication is ~1200 dollars.   Will forward to pharmacy team for assistance.

## 2023-07-04 ENCOUNTER — Other Ambulatory Visit (HOSPITAL_COMMUNITY): Payer: Self-pay

## 2023-07-04 NOTE — Telephone Encounter (Signed)
 New PA submitted  Pharmacy Patient Advocate Encounter   PA required; PA submitted to above mentioned insurance via CoverMyMeds Key/confirmation #/EOC [id number]. Status is pending

## 2023-07-05 NOTE — Progress Notes (Unsigned)
    SUBJECTIVE:   CHIEF COMPLAINT: HTN HPI:   Pt Carl [name] is a 38 y.o.  with history notable for MtF status, type 2 DM on insulin, HTN  presenting for follow up.   Mood  Doing better. Work is stressful due to multiple changes/staff leaving/changing roles.  Not taking estradiol as she is 'scared they will get taking away'/   Diabetes  Has CGM in place--range 70-200s. Mostly 110-150. Very few highs. Did have a few lows on days when GI symptoms flare. Taking Jardiance, Metformin, Tirzepatide, and insulin 40 units. No polyuria or polydipsia.  Back Pain This is an old issue but worsening. Bilateral back pain at end of the day after standing. Relieved by sitting or bending forward. No saddle anesthesia, changes in paresthesia (has neuropathy at baseline) or bladder symptoms. Uses heat but mostly change in position helps.  Hormones - Taking only spironolactone as above   PERTINENT  PMH / PSH/Family/Social History : type 2 DM First name Carl, Middle name [name]   OBJECTIVE:   BP 123/77   Pulse 100   Ht 5\' 9"  (1.753 m)   Wt 264 lb 12.8 oz (120.1 kg)   SpO2 100%   BMI 39.10 kg/m   Today's weight:  Last Weight  Most recent update: 07/06/2023  9:19 AM    Weight  120.1 kg (264 lb 12.8 oz)            Review of prior weights: [employer]   07/06/23 0916  Weight: 264 lb 12.8 oz (120.1 kg)    HEENT exam  R TM retracted with evidence of serous effusion L with normal appearance RRR Lungs clear Back exam Normal skin TTP along L paraspinal area and R SI Preserved strength Mute patellar reflexes   ASSESSMENT/PLAN:   Assessment & Plan Type 2 diabetes mellitus with retinopathy of both eyes, with long-term current use of insulin, macular edema presence unspecified, unspecified retinopathy severity (HCC) BG improving A1C is due end of April Reduce insulin to 30 units when GI symptoms flare  Increase Mounjaro  Essential hypertension At goal Continue current medications   Chronic abdominal pain Discussed dose reducing insulin on days of flare Has gastroparesis--letter for work given as she has symptoms which preclude in person work some days but can still work from home Likely needs FMLA given this condition flares unexpectedly and symptoms last a few days  Prefers Male Pronouns--- [employer]  Not taking estradiol Discussed mood, current support system She is very pleased with transition and wants to be on hormone therapy but is fearful--supportive listening provided  Elevated ALT measurement 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

## 2023-07-06 ENCOUNTER — Ambulatory Visit: Payer: BC Managed Care – PPO | Admitting: Family Medicine

## 2023-07-06 ENCOUNTER — Encounter: Payer: Self-pay | Admitting: Family Medicine

## 2023-07-06 ENCOUNTER — Telehealth: Payer: Self-pay

## 2023-07-06 VITALS — BP 123/77 | HR 100 | Ht 69.0 in | Wt 264.8 lb

## 2023-07-06 DIAGNOSIS — G4719 Other hypersomnia: Secondary | ICD-10-CM

## 2023-07-06 DIAGNOSIS — Z789 Other specified health status: Secondary | ICD-10-CM

## 2023-07-06 DIAGNOSIS — E11319 Type 2 diabetes mellitus with unspecified diabetic retinopathy without macular edema: Secondary | ICD-10-CM

## 2023-07-06 DIAGNOSIS — I10 Essential (primary) hypertension: Secondary | ICD-10-CM

## 2023-07-06 DIAGNOSIS — R7401 Elevation of levels of liver transaminase levels: Secondary | ICD-10-CM

## 2023-07-06 DIAGNOSIS — G629 Polyneuropathy, unspecified: Secondary | ICD-10-CM

## 2023-07-06 DIAGNOSIS — R109 Unspecified abdominal pain: Secondary | ICD-10-CM

## 2023-07-06 DIAGNOSIS — Z23 Encounter for immunization: Secondary | ICD-10-CM

## 2023-07-06 DIAGNOSIS — Z794 Long term (current) use of insulin: Secondary | ICD-10-CM

## 2023-07-06 DIAGNOSIS — G8929 Other chronic pain: Secondary | ICD-10-CM

## 2023-07-06 DIAGNOSIS — Z79899 Other long term (current) drug therapy: Secondary | ICD-10-CM

## 2023-07-06 DIAGNOSIS — M545 Low back pain, unspecified: Secondary | ICD-10-CM

## 2023-07-06 MED ORDER — MOUNJARO 10 MG/0.5ML ~~LOC~~ SOAJ: 10.0000 mg | SUBCUTANEOUS | 0 refills | Status: DC

## 2023-07-06 MED ORDER — CYCLOBENZAPRINE HCL 10 MG PO TABS
10.0000 mg | ORAL_TABLET | Freq: Three times a day (TID) | ORAL | 0 refills | Status: AC | PRN
Start: 2023-07-06 — End: ?

## 2023-07-06 NOTE — Assessment & Plan Note (Addendum)
 Discussed dose reducing insulin on days of flare Has gastroparesis--letter for work given as she has symptoms which preclude in person work some days but can still work from home Likely needs FMLA given this condition flares unexpectedly and symptoms last a few days

## 2023-07-06 NOTE — Telephone Encounter (Signed)
Referral placed.  Mandy Fitzwater, MD  Family Medicine Teaching Service   

## 2023-07-06 NOTE — Assessment & Plan Note (Addendum)
 At goal. Continue current medications.

## 2023-07-06 NOTE — Assessment & Plan Note (Addendum)
 BG improving A1C is due end of April Reduce insulin to 30 units when GI symptoms flare  Increase Mounjaro

## 2023-07-06 NOTE — Patient Instructions (Addendum)
 It was wonderful to see you today.  Please bring ALL of your medications with you to every visit.   Today we talked about:  Schedule your eye doctor visit  Please call to schedule your sleep study  Phone: [phone]  Try your Hyzaar at night   After you finish 7.5 mg Mounjaro start 10 mg  An x-ray was ordered for you---you do not need an appointment to have this completed.  I recommend going to [facility] [address] [place]  If the results are normal,I will send you a letter  I will call you with results if anything is abnormal   I sent in a muscle relaxer to take at night for this    Reduce insulin to 30 units if your stomach is acting up    Please follow up in 2 months   Thank you for choosing [facility].   Please call [phone] with any questions about today's appointment.  Please be sure to schedule follow up at the front  desk before you leave today.   Terisa Starr, MD  Family Medicine

## 2023-07-06 NOTE — Telephone Encounter (Signed)
 Patient calls nurse line in regards to sleep study.   She reports she was see today and was given [facility] Sleep Study information.   She reports she called them, however they stated an order was not in the system.   She reports if PCP has not ordered this yet, please place at [facility] location if able.   Will forward to PCP and referral coordinator.

## 2023-07-06 NOTE — Assessment & Plan Note (Signed)
 Not taking estradiol Discussed mood, current support system She is very pleased with transition and wants to be on hormone therapy but is fearful--supportive listening provided

## 2023-07-07 LAB — IRON,TIBC AND FERRITIN PANEL
Ferritin: 128 ng/mL (ref 30–400)
Iron Saturation: 29 % (ref 15–55)
Iron: 90 ug/dL (ref 38–169)
Total Iron Binding Capacity: 315 ug/dL (ref 250–450)
UIBC: 225 ug/dL (ref 111–343)

## 2023-07-07 LAB — HEPATIC FUNCTION PANEL
ALT: 45 IU/L — ABNORMAL HIGH (ref 0–44)
AST: 28 IU/L (ref 0–40)
Albumin: 4.6 g/dL (ref 4.1–5.1)
Alkaline Phosphatase: 118 IU/L (ref 44–121)
Bilirubin Total: 0.4 mg/dL (ref 0.0–1.2)
Bilirubin, Direct: 0.13 mg/dL (ref 0.00–0.40)
Total Protein: 6.7 g/dL (ref 6.0–8.5)

## 2023-07-08 ENCOUNTER — Encounter: Payer: Self-pay | Admitting: Family Medicine

## 2023-07-10 ENCOUNTER — Encounter: Payer: Self-pay | Admitting: Family Medicine

## 2023-07-10 ENCOUNTER — Ambulatory Visit
Admission: RE | Admit: 2023-07-10 | Discharge: 2023-07-10 | Disposition: A | Discharge status: Home / Self Care | Source: Ambulatory Visit | Attending: Family Medicine | Admitting: Family Medicine

## 2023-07-10 ENCOUNTER — Ambulatory Visit (HOSPITAL_COMMUNITY)
Admission: RE | Admit: 2023-07-10 | Discharge: 2023-07-10 | Disposition: A | Discharge status: Home / Self Care | Payer: BC Managed Care – PPO | Source: Ambulatory Visit | Attending: Family Medicine | Admitting: Family Medicine

## 2023-07-10 DIAGNOSIS — M545 Low back pain, unspecified: Secondary | ICD-10-CM

## 2023-07-10 DIAGNOSIS — R7401 Elevation of levels of liver transaminase levels: Secondary | ICD-10-CM | POA: Diagnosis present

## 2023-07-13 ENCOUNTER — Ambulatory Visit (HOSPITAL_COMMUNITY): Payer: BC Managed Care – PPO

## 2023-07-17 NOTE — Telephone Encounter (Signed)
Reviewed, completed, and signed form.  Note routed to RN team inbasket and placed completed form in RN Wall pocket in the front office.  Shadeed Colberg M Lorne Winkels, MD  

## 2023-08-06 ENCOUNTER — Other Ambulatory Visit: Payer: Self-pay | Admitting: *Deleted

## 2023-08-06 DIAGNOSIS — R109 Unspecified abdominal pain: Secondary | ICD-10-CM

## 2023-08-06 MED ORDER — POLYETHYLENE GLYCOL 3350 17 GM/SCOOP PO POWD: 17.0000 g | Freq: Two times a day (BID) | ORAL | 1 refills | Status: AC | PRN

## 2023-08-29 ENCOUNTER — Other Ambulatory Visit: Payer: Self-pay

## 2023-08-29 DIAGNOSIS — I10 Essential (primary) hypertension: Secondary | ICD-10-CM

## 2023-08-29 MED ORDER — LOSARTAN POTASSIUM-HCTZ 100-12.5 MG PO TABS: 1.0000 | ORAL_TABLET | Freq: Every day | ORAL | 3 refills | Status: AC

## 2023-09-03 ENCOUNTER — Telehealth: Admitting: Physician Assistant

## 2023-09-03 DIAGNOSIS — K047 Periapical abscess without sinus: Secondary | ICD-10-CM

## 2023-09-03 MED ORDER — IBUPROFEN 600 MG PO TABS
600.0000 mg | ORAL_TABLET | Freq: Three times a day (TID) | ORAL | 0 refills | Status: DC | PRN
Start: 2023-09-03 — End: 2023-10-21

## 2023-09-03 MED ORDER — AMOXICILLIN-POT CLAVULANATE 875-125 MG PO TABS: 1.0000 | ORAL_TABLET | Freq: Two times a day (BID) | ORAL | 0 refills | Status: DC

## 2023-09-03 NOTE — Progress Notes (Signed)

## 2023-09-10 ENCOUNTER — Other Ambulatory Visit: Payer: Self-pay | Admitting: *Deleted

## 2023-09-10 MED ORDER — MOUNJARO 10 MG/0.5ML ~~LOC~~ SOAJ: 10.0000 mg | SUBCUTANEOUS | 0 refills | Status: DC

## 2023-09-28 ENCOUNTER — Telehealth: Admitting: Physician Assistant

## 2023-09-28 DIAGNOSIS — K591 Functional diarrhea: Secondary | ICD-10-CM

## 2023-09-28 DIAGNOSIS — R109 Unspecified abdominal pain: Secondary | ICD-10-CM | POA: Diagnosis not present

## 2023-09-28 MED ORDER — DICYCLOMINE HCL 10 MG PO CAPS: 10.0000 mg | ORAL_CAPSULE | Freq: Three times a day (TID) | ORAL | 0 refills | Status: AC

## 2023-09-28 NOTE — Patient Instructions (Signed)
 Auther C Schellenberg, thank you for joining Angelia Kelp, PA-C for today's virtual visit.  While this provider is not your primary care provider (PCP), if your PCP is located in our provider database this encounter information will be shared with them immediately following your visit.   A [place] MyChart account gives you access to today's visit and all your visits, tests, and labs performed at [facility] " click here if you don't have a [place] MyChart account or go to [email or web address]  Consent: (Patient) Carl [name] provided verbal consent for this virtual visit at the beginning of the encounter.  Current Medications:  Current Outpatient Medications:    dicyclomine (BENTYL) 10 MG capsule, Take 1 capsule (10 mg total) by mouth 4 (four) times daily -  before meals and at bedtime for 7 days., Disp: 28 capsule, Rfl: 0   albuterol  (VENTOLIN  HFA) 108 (90 Base) MCG/ACT inhaler, Inhale 2 puffs into the lungs every 6 (six) hours as needed for wheezing or shortness of breath., Disp: 18 g, Rfl: 3   amoxicillin -clavulanate (AUGMENTIN ) 875-125 MG tablet, Take 1 tablet by mouth 2 (two) times daily., Disp: 14 tablet, Rfl: 0   atorvastatin  (LIPITOR) 20 MG tablet, Take 1 tablet (20 mg total) by mouth at bedtime., Disp: 90 tablet, Rfl: 3   cetirizine (ZYRTEC) 10 MG tablet, Take 10 mg by mouth daily. (Patient not taking: Reported on 07/06/2023), Disp: , Rfl:    Continuous Glucose Sensor (FREESTYLE LIBRE 3 SENSOR) MISC, 1 application  by Does not apply route as directed. Apply one sensor every 14 days, Disp: 2 each, Rfl: 4   cyclobenzaprine  (FLEXERIL ) 10 MG tablet, Take 1 tablet (10 mg total) by mouth 3 (three) times daily as needed for muscle spasms., Disp: 30 tablet, Rfl: 0   dapagliflozin  propanediol (FARXIGA ) 10 MG TABS tablet, Take 1 tablet (10 mg total) by mouth daily before breakfast., Disp: 90 tablet, Rfl: 3   DULoxetine  (CYMBALTA ) 30 MG capsule, Take 1 capsule (30 mg  total) by mouth daily., Disp: 90 capsule, Rfl: 3   emtricitabine -tenofovir  (TRUVADA) 200-300 MG tablet, Take 1 tablet by mouth daily., Disp: 90 tablet, Rfl: 0   estradiol  (CLIMARA  - DOSED IN MG/24 HR) 0.1 mg/24hr patch, Place 1 patch (0.1 mg total) onto the skin once a week. (Patient not taking: Reported on 07/06/2023), Disp: 4 patch, Rfl: 12   fluticasone  (FLONASE ) 50 MCG/ACT nasal spray, Place 2 sprays into both nostrils daily., Disp: 16 g, Rfl: 6   ibuprofen  (ADVIL ) 600 MG tablet, Take 1 tablet (600 mg total) by mouth every 8 (eight) hours as needed., Disp: 30 tablet, Rfl: 0   insulin  glargine (LANTUS ) 100 UNIT/ML Solostar Pen, Inject 12 Units into the skin daily. (Patient taking differently: Inject 40 Units into the skin daily.), Disp: 15 mL, Rfl: 3   Insulin  Pen Needle (NOVOFINE) 30G X 8 MM MISC, Use to inject insulin  daily, Disp: 100 each, Rfl: 11   Insulin  Pen Needle (ULTICARE SHORT PEN NEEDLES) 31G X 8 MM MISC, Use to inject insulin  as directed, Disp: 100 each, Rfl: 3   losartan -hydrochlorothiazide (HYZAAR) 100-12.5 MG tablet, Take 1 tablet by mouth daily., Disp: 90 tablet, Rfl: 3   metFORMIN  (GLUCOPHAGE -XR) 750 MG 24 hr tablet, Take 1 tablet (750 mg total) by mouth 2 (two) times daily with a meal., Disp: 180 tablet, Rfl: 3   Multiple Vitamins-Minerals (MULTIVITAMIN WITH MINERALS) tablet, Take 1 tablet by mouth daily., Disp: , Rfl:    ondansetron  (ZOFRAN -ODT) 4  MG disintegrating tablet, Take 1 tablet (4 mg total) by mouth every 8 (eight) hours as needed for nausea or vomiting., Disp: 20 tablet, Rfl: 0   polyethylene glycol powder (GLYCOLAX /MIRALAX ) 17 GM/SCOOP powder, Take 17 g by mouth 2 (two) times daily as needed., Disp: 3350 g, Rfl: 1   spironolactone  (ALDACTONE ) 25 MG tablet, Take 1 tablet (25 mg total) by mouth 2 (two) times daily., Disp: 60 tablet, Rfl: 2   tirzepatide  (MOUNJARO ) 10 MG/0.5ML Pen, Inject 10 mg into the skin once a week., Disp: 2 mL, Rfl: 0   Medications ordered in this  encounter:  Meds ordered this encounter  Medications   dicyclomine  (BENTYL ) 10 MG capsule    Sig: Take 1 capsule (10 mg total) by mouth 4 (four) times daily -  before meals and at bedtime for 7 days.    Dispense:  28 capsule    Refill:  0    Supervising Provider:   Corine Dice [[id number]]     *If you need refills on other medications prior to your next appointment, please contact your pharmacy*  Follow-Up: Call back or seek an in-person evaluation if the symptoms worsen or if the condition fails to improve as anticipated.  [facility] [phone]  Other Instructions Food Choices to Help Relieve Diarrhea, Adult Diarrhea can make you feel weak and cause you to become dehydrated. Dehydration is a condition in which there is not enough water or other fluids in the body. It is important to choose the right foods and drinks to: Relieve diarrhea. Replace lost fluids and nutrients. Prevent dehydration. What are tips for following this plan? Relieving diarrhea Avoid foods that make your diarrhea worse. These may include: Foods and drinks that are sweetened with high-fructose corn syrup, honey, or sweeteners such as xylitol, sorbitol, and mannitol. Check food labels for these ingredients. Fried, greasy, or spicy foods. Raw fruits and vegetables. Eat foods that are rich in probiotics. These include foods such as yogurt and fermented milk products. Probiotics can help increase healthy bacteria in your stomach and intestines (gastrointestinal or GI tract). This may help digestion and stop diarrhea. If you have lactose intolerance, avoid dairy products. These may make your diarrhea worse. Take medicine to help stop diarrhea only as told by your health care provider. Replacing nutrients  Eat bland, easy-to-digest foods in small amounts as you are able, until your diarrhea starts to get better. These foods include bananas, applesauce, rice, toast, and crackers. Over time, add  nutrient-rich foods as your body tolerates them or as told by your health care provider. These include: Well-cooked protein foods, such as eggs, lean meats like fish or chicken without skin, and tofu. Peeled, seeded, and soft-cooked fruits and vegetables. Low-fat dairy products. Whole grains. Take vitamin and mineral supplements as told by your health care provider. Preventing dehydration  Start by sipping water or a solution to prevent dehydration (oral rehydration solution, or ORS). This is a drink that helps replace fluids and minerals your body has lost. You can buy an ORS at pharmacies and retail stores. Try to drink at least 8-10 cups (2,000-2,500 mL) of fluid each day to help replace lost fluids. If your urine is pale yellow, you are getting enough fluids. You may drink other liquids in addition to water, such as fruit juice that you have added water to (diluted fruit juice) or low-calorie sports drinks, as tolerated or as told by your health care provider. Avoid drinks with caffeine, such as  coffee, tea, or soft drinks. Avoid alcohol. This information is not intended to replace advice given to you by your health care provider. Make sure you discuss any questions you have with your health care provider. Document Revised: 10/11/2021 Document Reviewed: 10/11/2021 Elsevier Patient Education  2024 Elsevier Inc.   If you have been instructed to have an in-person evaluation today at a local Urgent Care facility, please use the link below. It will take you to a list of all of our available [place] [facility], including address, phone number and hours of operation. Please do not delay care.  [facility]  If you or a family member do not have a primary care provider, use the link below to schedule a visit and establish care. When you choose a [place] primary care physician or advanced practice provider, you gain a long-term partner in health. Find a Primary Care  Provider  Learn more about [facility] in-office and virtual care options: [place] - Get Care Now

## 2023-09-28 NOTE — Progress Notes (Signed)
 Virtual Visit Consent   Carl [name], you are scheduled for a virtual visit with a [facility] provider today. Just as with appointments in the office, your consent must be obtained to participate. Your consent will be active for this visit and any virtual visit you may have with one of our providers in the next 365 days. If you have a MyChart account, a copy of this consent can be sent to you electronically.  As this is a virtual visit, video technology does not allow for your provider to perform a traditional examination. This may limit your provider's ability to fully assess your condition. If your provider identifies any concerns that need to be evaluated in person or the need to arrange testing (such as labs, EKG, etc.), we will make arrangements to do so. Although advances in technology are sophisticated, we cannot ensure that it will always work on either your end or our end. If the connection with a video visit is poor, the visit may have to be switched to a telephone visit. With either a video or telephone visit, we are not always able to ensure that we have a secure connection.  By engaging in this virtual visit, you consent to the provision of healthcare and authorize for your insurance to be billed (if applicable) for the services provided during this visit. Depending on your insurance coverage, you may receive a charge related to this service.  I need to obtain your verbal consent now. Are you willing to proceed with your visit today? Carl [name] has provided verbal consent on 09/28/2023 for a virtual visit (video or telephone). Angelia Kelp, PA-C  Date: 09/28/2023 10:12 AM   Virtual Visit via Video Note   I, Angelia Kelp, connected with  Carl [name]  ([id number], 1985/08/19) on 09/28/23 at  8:45 AM EDT by a video-enabled telemedicine application and verified that I am speaking with the correct person using two identifiers.  Location: Patient: Virtual Visit  Location Patient: Home Provider: Virtual Visit Location Provider: Home Office   I discussed the limitations of evaluation and management by telemedicine and the availability of in person appointments. The patient expressed understanding and agreed to proceed.    History of Present Illness: Carl [name] is a 38 y.o. who identifies as a male who was assigned adult at birth, and is being seen today for acute on chronic GI issue. On Tuesday started to have nausea, vomiting, and diarrhea. Has been doing bowel rest with liquid diet and slowly increasing diet. Using Zofran  for N/V. That has improved but still having cramping and diarrhea that comes within 30 minutes of eating a meal.   Problems:  Patient Active Problem List   Diagnosis Date Noted   Chronic abdominal pain 10/21/2021   Polyneuropathy 02/04/2021   PREP Therapy  02/04/2021   Intermittent asthma 12/21/2020   On pre-exposure prophylaxis for HIV 11/07/2018   Dyslipidemia 02/15/2018   Diabetes (HCC) 01/14/2018   Essential hypertension 01/14/2018   Prefers Male Pronouns--- Carl [name] 01/14/2018    Allergies:  Allergies  Allergen Reactions   Lisinopril  Cough   Metformin  And Related Diarrhea    Has tried multiple times without success    Medications:  Current Outpatient Medications:    dicyclomine (BENTYL) 10 MG capsule, Take 1 capsule (10 mg total) by mouth 4 (four) times daily -  before meals and at bedtime for 7 days., Disp: 28 capsule, Rfl: 0   albuterol  (VENTOLIN  HFA) 108 (90 Base)  MCG/ACT inhaler, Inhale 2 puffs into the lungs every 6 (six) hours as needed for wheezing or shortness of breath., Disp: 18 g, Rfl: 3   amoxicillin -clavulanate (AUGMENTIN ) 875-125 MG tablet, Take 1 tablet by mouth 2 (two) times daily., Disp: 14 tablet, Rfl: 0   atorvastatin  (LIPITOR) 20 MG tablet, Take 1 tablet (20 mg total) by mouth at bedtime., Disp: 90 tablet, Rfl: 3   cetirizine (ZYRTEC) 10 MG tablet, Take 10 mg by mouth daily.  (Patient not taking: Reported on 07/06/2023), Disp: , Rfl:    Continuous Glucose Sensor (FREESTYLE LIBRE 3 SENSOR) MISC, 1 application  by Does not apply route as directed. Apply one sensor every 14 days, Disp: 2 each, Rfl: 4   cyclobenzaprine  (FLEXERIL ) 10 MG tablet, Take 1 tablet (10 mg total) by mouth 3 (three) times daily as needed for muscle spasms., Disp: 30 tablet, Rfl: 0   dapagliflozin  propanediol (FARXIGA ) 10 MG TABS tablet, Take 1 tablet (10 mg total) by mouth daily before breakfast., Disp: 90 tablet, Rfl: 3   DULoxetine  (CYMBALTA ) 30 MG capsule, Take 1 capsule (30 mg total) by mouth daily., Disp: 90 capsule, Rfl: 3   emtricitabine -tenofovir  (TRUVADA) 200-300 MG tablet, Take 1 tablet by mouth daily., Disp: 90 tablet, Rfl: 0   estradiol  (CLIMARA  - DOSED IN MG/24 HR) 0.1 mg/24hr patch, Place 1 patch (0.1 mg total) onto the skin once a week. (Patient not taking: Reported on 07/06/2023), Disp: 4 patch, Rfl: 12   fluticasone  (FLONASE ) 50 MCG/ACT nasal spray, Place 2 sprays into both nostrils daily., Disp: 16 g, Rfl: 6   ibuprofen  (ADVIL ) 600 MG tablet, Take 1 tablet (600 mg total) by mouth every 8 (eight) hours as needed., Disp: 30 tablet, Rfl: 0   insulin  glargine (LANTUS ) 100 UNIT/ML Solostar Pen, Inject 12 Units into the skin daily. (Patient taking differently: Inject 40 Units into the skin daily.), Disp: 15 mL, Rfl: 3   Insulin  Pen Needle (NOVOFINE) 30G X 8 MM MISC, Use to inject insulin  daily, Disp: 100 each, Rfl: 11   Insulin  Pen Needle (ULTICARE SHORT PEN NEEDLES) 31G X 8 MM MISC, Use to inject insulin  as directed, Disp: 100 each, Rfl: 3   losartan -hydrochlorothiazide (HYZAAR) 100-12.5 MG tablet, Take 1 tablet by mouth daily., Disp: 90 tablet, Rfl: 3   metFORMIN  (GLUCOPHAGE -XR) 750 MG 24 hr tablet, Take 1 tablet (750 mg total) by mouth 2 (two) times daily with a meal., Disp: 180 tablet, Rfl: 3   Multiple Vitamins-Minerals (MULTIVITAMIN WITH MINERALS) tablet, Take 1 tablet by mouth daily.,  Disp: , Rfl:    ondansetron  (ZOFRAN -ODT) 4 MG disintegrating tablet, Take 1 tablet (4 mg total) by mouth every 8 (eight) hours as needed for nausea or vomiting., Disp: 20 tablet, Rfl: 0   polyethylene glycol powder (GLYCOLAX /MIRALAX ) 17 GM/SCOOP powder, Take 17 g by mouth 2 (two) times daily as needed., Disp: 3350 g, Rfl: 1   spironolactone  (ALDACTONE ) 25 MG tablet, Take 1 tablet (25 mg total) by mouth 2 (two) times daily., Disp: 60 tablet, Rfl: 2   tirzepatide  (MOUNJARO ) 10 MG/0.5ML Pen, Inject 10 mg into the skin once a week., Disp: 2 mL, Rfl: 0  Observations/Objective: Patient is well-developed, well-nourished in no acute distress.  Resting comfortably at home.  Head is normocephalic, atraumatic.  No labored breathing.  Speech is clear and coherent with logical content.  Patient is alert and oriented at baseline.    Assessment and Plan: 1. Abdominal cramping (Primary) - dicyclomine (BENTYL) 10 MG capsule; Take 1  capsule (10 mg total) by mouth 4 (four) times daily -  before meals and at bedtime for 7 days.  Dispense: 28 capsule; Refill: 0  2. Functional diarrhea - dicyclomine (BENTYL) 10 MG capsule; Take 1 capsule (10 mg total) by mouth 4 (four) times daily -  before meals and at bedtime for 7 days.  Dispense: 28 capsule; Refill: 0  - Acute on chronic GI flare, being worked up for possible gastroparesis - Symptoms lasted slightly longer than previous flares - Bentyl added for the abdominal cramping and diarrhea to use PRN when bad - Work note provided - Keep follow up with PCP on 10/05/23  Follow Up Instructions: I discussed the assessment and treatment plan with the patient. The patient was provided an opportunity to ask questions and all were answered. The patient agreed with the plan and demonstrated an understanding of the instructions.  A copy of instructions were sent to the patient via MyChart unless otherwise noted below.    The patient was advised to call back or seek an  in-person evaluation if the symptoms worsen or if the condition fails to improve as anticipated.    Angelia Kelp, PA-C

## 2023-10-02 ENCOUNTER — Encounter: Payer: Self-pay | Admitting: Family Medicine

## 2023-10-04 NOTE — Progress Notes (Unsigned)
 [facility] Telemedicine Visit  Patient consented to have virtual visit and was identified by name and date of birth. 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   ROS: per HPI  Pertinent PMHx: ***  Exam:  There were no vitals taken for this visit.  Respiratory: ***  Assessment/Plan:  No problem-specific Assessment & Plan notes found for this encounter.    Time spent during visit with patient: *** minutes  {Billing info - this will automatically delete when the note is signed:1} {For Audio only, bill [id number] / [phone] as usual with modifier 93 attached:1} {For Audio & Video, bill [id number] / [phone] as usual with modifier 95 attached:1}

## 2023-10-05 ENCOUNTER — Encounter: Payer: Self-pay | Admitting: Family Medicine

## 2023-10-05 ENCOUNTER — Telehealth: Payer: Self-pay | Admitting: Family Medicine

## 2023-10-05 ENCOUNTER — Telehealth (INDEPENDENT_AMBULATORY_CARE_PROVIDER_SITE_OTHER): Admitting: Family Medicine

## 2023-10-05 VITALS — BP 127/77 | Wt 262.0 lb

## 2023-10-05 DIAGNOSIS — I10 Essential (primary) hypertension: Secondary | ICD-10-CM

## 2023-10-05 DIAGNOSIS — M545 Low back pain, unspecified: Secondary | ICD-10-CM

## 2023-10-05 DIAGNOSIS — Z789 Other specified health status: Secondary | ICD-10-CM | POA: Diagnosis not present

## 2023-10-05 DIAGNOSIS — Z113 Encounter for screening for infections with a predominantly sexual mode of transmission: Secondary | ICD-10-CM | POA: Diagnosis not present

## 2023-10-05 DIAGNOSIS — E785 Hyperlipidemia, unspecified: Secondary | ICD-10-CM

## 2023-10-05 DIAGNOSIS — E11319 Type 2 diabetes mellitus with unspecified diabetic retinopathy without macular edema: Secondary | ICD-10-CM | POA: Diagnosis not present

## 2023-10-05 DIAGNOSIS — Z794 Long term (current) use of insulin: Secondary | ICD-10-CM

## 2023-10-05 DIAGNOSIS — G8929 Other chronic pain: Secondary | ICD-10-CM

## 2023-10-05 DIAGNOSIS — R109 Unspecified abdominal pain: Secondary | ICD-10-CM

## 2023-10-05 MED ORDER — INSULIN GLARGINE 100 UNIT/ML SOLOSTAR PEN: 25.0000 [IU] | PEN_INJECTOR | SUBCUTANEOUS | 3 refills | Status: DC | PRN

## 2023-10-05 MED ORDER — ESTRADIOL 0.1 MG/24HR TD PTWK: 0.1000 mg | MEDICATED_PATCH | TRANSDERMAL | 12 refills | Status: AC

## 2023-10-05 MED ORDER — ATORVASTATIN CALCIUM 40 MG PO TABS: 40.0000 mg | ORAL_TABLET | Freq: Every day | ORAL | 3 refills | Status: AC

## 2023-10-05 MED ORDER — FREESTYLE LIBRE 3 SENSOR MISC: 1.0000 | 4 refills | Status: DC

## 2023-10-05 MED ORDER — SPIRONOLACTONE 25 MG PO TABS: 25.0000 mg | ORAL_TABLET | Freq: Two times a day (BID) | ORAL | 2 refills | Status: DC

## 2023-10-05 MED ORDER — ONDANSETRON 4 MG PO TBDP: 4.0000 mg | ORAL_TABLET | Freq: Three times a day (TID) | ORAL | 0 refills | Status: DC | PRN

## 2023-10-05 MED ORDER — MOUNJARO 15 MG/0.5ML ~~LOC~~ SOAJ: 15.0000 mg | SUBCUTANEOUS | 2 refills | Status: DC

## 2023-10-05 NOTE — Assessment & Plan Note (Signed)
 Reached out to referral coordinator re: sleep study  At goal  Continue current medications Check CMP

## 2023-10-05 NOTE — Telephone Encounter (Signed)
 Labs ordered Please fax to   Address: [address], [place], [place] [address] Phone: [phone]  Thanks

## 2023-10-05 NOTE — Patient Instructions (Addendum)
 It was wonderful to talk with you today!!   Please bring ALL of your medications with you to every visit.   Today we talked about:  -- I sent in Mounjaro  at the higher dose  CALL ME if you have NAUSEA VOMITING GI side effects Sugars <70  If your sugar is <100 in the morning--reduce your insulin  by 5 units  I sent in your lantus  and other refills  I will send in your Truvada once your labs return  We will fax your labs to   You are due for your eye exam!   Call me if you do not hear about your sleep study in the coming week     Please follow up in 2-3  months   Thank you for choosing [facility].   Please call [phone] with any questions about today's appointment.  Please be sure to schedule follow up at the front  desk before you leave today.   Otho Blitz, MD  Family Medicine

## 2023-10-05 NOTE — Assessment & Plan Note (Signed)
 Discussed elevated triglycerides Increase statin ? No EtOH use Suspect due to DM Repeat ordered

## 2023-10-05 NOTE — Assessment & Plan Note (Signed)
 Refilled insulin  Referral to Optho  A1C ordered

## 2023-10-05 NOTE — Assessment & Plan Note (Signed)
 Refilled therapy Monitoring labs in 3 months as has been off medicaiton

## 2023-10-05 NOTE — Assessment & Plan Note (Signed)
 Monitoring labs today  Will refill pending labs

## 2023-10-15 ENCOUNTER — Encounter: Payer: Self-pay | Admitting: Family Medicine

## 2023-10-16 MED ORDER — TIRZEPATIDE 12.5 MG/0.5ML ~~LOC~~ SOAJ: 12.5000 mg | SUBCUTANEOUS | 2 refills | Status: DC

## 2023-10-20 LAB — COMPREHENSIVE METABOLIC PANEL WITH GFR
ALT: 35 IU/L (ref 0–44)
AST: 23 IU/L (ref 0–40)
Albumin: 4.3 g/dL (ref 4.1–5.1)
Alkaline Phosphatase: 118 IU/L (ref 44–121)
BUN/Creatinine Ratio: 13 (ref 9–20)
BUN: 14 mg/dL (ref 6–20)
Bilirubin Total: <0.2 mg/dL (ref 0.0–1.2)
CO2: 22 mmol/L (ref 20–29)
Calcium: 9.9 mg/dL (ref 8.7–10.2)
Chloride: 104 mmol/L (ref 96–106)
Creatinine, Ser: 1.07 mg/dL (ref 0.76–1.27)
Globulin, Total: 2.1 g/dL (ref 1.5–4.5)
Glucose: 238 mg/dL — ABNORMAL HIGH (ref 70–99)
Potassium: 4.2 mmol/L (ref 3.5–5.2)
Sodium: 144 mmol/L (ref 134–144)
Total Protein: 6.4 g/dL (ref 6.0–8.5)
eGFR: 92 mL/min/{1.73_m2} (ref >59)

## 2023-10-20 LAB — HEPATITIS B SURFACE ANTIGEN: Hepatitis B Surface Ag: NEGATIVE

## 2023-10-20 LAB — CBC
Hematocrit: 41.9 % (ref 37.5–51.0)
Hemoglobin: 14.4 g/dL (ref 13.0–17.7)
MCH: 30.5 pg (ref 26.6–33.0)
MCHC: 34.4 g/dL (ref 31.5–35.7)
MCV: 89 fL (ref 79–97)
Platelets: 324 10*3/uL (ref 150–450)
RBC: 4.72 x10E6/uL (ref 4.14–5.80)
RDW: 12 % (ref 11.6–15.4)
WBC: 7 10*3/uL (ref 3.4–10.8)

## 2023-10-20 LAB — TRIGLYCERIDES: Triglycerides: 371 mg/dL — ABNORMAL HIGH (ref 0–149)

## 2023-10-20 LAB — RPR: RPR Ser Ql: NONREACTIVE

## 2023-10-20 LAB — HEMOGLOBIN A1C
Est. average glucose Bld gHb Est-mCnc: 180 mg/dL
Hgb A1c MFr Bld: 7.9 % — ABNORMAL HIGH (ref 4.8–5.6)

## 2023-10-20 LAB — HCV AB W REFLEX TO QUANT PCR: HCV Ab: NONREACTIVE

## 2023-10-20 LAB — HIV ANTIBODY (ROUTINE TESTING W REFLEX): HIV Screen 4th Generation wRfx: NONREACTIVE

## 2023-10-20 LAB — HCV INTERPRETATION

## 2023-10-21 ENCOUNTER — Ambulatory Visit: Payer: Self-pay | Admitting: Family Medicine

## 2023-10-21 DIAGNOSIS — Z9189 Other specified personal risk factors, not elsewhere classified: Secondary | ICD-10-CM

## 2023-10-21 MED ORDER — EMTRICITABINE-TENOFOVIR DF 200-300 MG PO TABS
1.0000 | ORAL_TABLET | Freq: Every day | ORAL | 0 refills | Status: DC
Start: 2023-10-21 — End: 2024-03-07

## 2023-12-12 ENCOUNTER — Encounter: Payer: Self-pay | Admitting: Family Medicine

## 2023-12-13 MED ORDER — FREESTYLE LIBRE 3 PLUS SENSOR MISC: 11 refills | Status: AC

## 2024-01-03 ENCOUNTER — Other Ambulatory Visit: Payer: Self-pay | Admitting: *Deleted

## 2024-01-03 DIAGNOSIS — Z789 Other specified health status: Secondary | ICD-10-CM

## 2024-01-03 MED ORDER — SPIRONOLACTONE 25 MG PO TABS: 25.0000 mg | ORAL_TABLET | Freq: Two times a day (BID) | ORAL | 2 refills | Status: DC

## 2024-01-17 ENCOUNTER — Other Ambulatory Visit: Payer: Self-pay | Admitting: Medical Genetics

## 2024-01-17 DIAGNOSIS — Z006 Encounter for examination for normal comparison and control in clinical research program: Secondary | ICD-10-CM

## 2024-01-18 ENCOUNTER — Other Ambulatory Visit

## 2024-01-21 ENCOUNTER — Other Ambulatory Visit
Admission: RE | Admit: 2024-01-21 | Discharge: 2024-01-21 | Disposition: A | Discharge status: Home / Self Care | Payer: Self-pay | Source: Ambulatory Visit | Attending: Medical Genetics | Admitting: Medical Genetics

## 2024-01-21 DIAGNOSIS — Z006 Encounter for examination for normal comparison and control in clinical research program: Secondary | ICD-10-CM | POA: Insufficient documentation

## 2024-01-29 LAB — GENECONNECT MOLECULAR SCREEN: Genetic Analysis Overall Interpretation: NEGATIVE

## 2024-02-07 ENCOUNTER — Telehealth: Admitting: Physician Assistant

## 2024-02-07 DIAGNOSIS — A084 Viral intestinal infection, unspecified: Secondary | ICD-10-CM

## 2024-02-07 MED ORDER — ONDANSETRON 4 MG PO TBDP: 4.0000 mg | ORAL_TABLET | Freq: Three times a day (TID) | ORAL | 0 refills | Status: AC | PRN

## 2024-02-07 NOTE — Progress Notes (Signed)
 E-Visit for Diarrhea  We are sorry that you are not feeling well.  Here is how we plan to help!  Based on what you have shared with me it looks like you have Acute Infectious Diarrhea.  Most cases of acute diarrhea are due to infections with virus and bacteria and are self-limited conditions lasting less than 14 days.  For your symptoms you may take Imodium 2 mg tablets that are over the counter at your local pharmacy. Take two tablet now and then one after each loose stool up to 6 a day.  Also as soon as you are able to tolerate foods, making sure to start with very bland foods -- banana, rice, applesauce, toast, etc. This will help bulk your stools back up so they are not loose in caliber.  Antibiotics are not needed for most people with diarrhea.  I have sent in a refill of your: Zofran  4 mg 1 tablet every 8 hours as needed for nausea and vomiting  HOME CARE We recommend changing your diet to help with your symptoms for the next few days. Drink plenty of fluids that contain water salt and sugar. Sports drinks such as Gatorade may help.  You may try broths, soups, bananas, applesauce, soft breads, mashed potatoes or crackers.  You are considered infectious for as long as the diarrhea continues. Hand washing or use of alcohol based hand sanitizers is recommend. It is best to stay out of work or school until your symptoms stop.   GET HELP RIGHT AWAY If you have dark yellow colored urine or do not pass urine frequently you should drink more fluids.   If your symptoms worsen  If you feel like you are going to pass out (faint) You have a new problem  MAKE SURE YOU  Understand these instructions. Will watch your condition. Will get help right away if you are not doing well or get worse.  Thank you for choosing an e-visit.  Your e-visit answers were reviewed by a board certified advanced clinical practitioner to complete your personal care plan. Depending upon the condition, your plan  could have included both over the counter or prescription medications.  Please review your pharmacy choice. Make sure the pharmacy is open so you can pick up prescription now. If there is a problem, you may contact your provider through [employer] and have the prescription routed to another pharmacy.  Your safety is important to us . If you have drug allergies check your prescription carefully.   For the next 24 hours you can use MyChart to ask questions about today's visit, request a non-urgent call back, or ask for a work or school excuse. You will get an email in the next two days asking about your experience. I hope that your e-visit has been valuable and will speed your recovery.

## 2024-02-29 ENCOUNTER — Encounter: Payer: Self-pay | Admitting: Family Medicine

## 2024-02-29 NOTE — Progress Notes (Signed)
 [facility] Telemedicine Visit  Patient consented to have virtual visit and was identified by name and date of birth. Method of visit: Telephone  Encounter participants: Patient: Carl [name]  located at home Provider: Suzann CHRISTELLA Daring - located at office Others (if applicable): none  Chief Complaint: discuss estradiol    HPI: Alm Carl [name] reports overall she is doing well.  She is working on some research classes right now.  She still works.  She is having issues with transportation but no other social drivers.  Reports her mood is overall good.  The patient has a history of significant neuropathic pain related to her diabetes.  She is on Cymbalta  30 mg.  She intermittently finds that her neuropathy is flaring up.  She is curious about higher dose to be beneficial  Diabetes Glucoses running 108-115. No lows, no significant lows.  She is taking Mounjaro  15 mg weekly, metformin  twice daily, Lantus  20 units and an SGLT2 inhibitor.  BP  Average 115/85. No chest pains, no dyspnea.   Allergy  Patient reports allergies. Needs antihistamine and albuterol  inhaler refills. No chest pain or dyspnea.    The patient reports her stomach has been acting up more.  She has been trying over-the-counter PPI and H2 blocker without success.  She does not think this is related to the Mounjaro .  She denies nausea vomiting melena or hematochezia.  She previously saw gastroenterology for what was thought to be possibly irritable bowel syndrome or gastroparesis (although emptying study was normal).  She is interested in seeing them again.  ROS: per HPI  Pertinent PMHx: Hypertension, previously on estradiol  patches but did not have good success with them.  Exam:  BP at goal as above   Assessment/Plan:  Assessment & Plan Essential hypertension At goal BMP today  Type 2 diabetes mellitus with retinopathy of both eyes, with long-term current use of insulin , macular edema presence  unspecified, unspecified retinopathy severity (HCC) Due for A1c and lipids PREP Therapy  Monitoring labs sent No new partners Will refill pending HIV  Dyspepsia ? Question if medication side effect, gastroparesis Hello Discussed going down on Mounjaro  Would like to stay at current dose  Referral to GI Trial pantoprazole 40 mg   Mild intermittent asthma without complication Reports allergies worsened Refilled antihistamine and inhaler  Polyneuropathy Discussed options Increase Cymbalta  to 60 mg Discussed side effects   Time spent during visit with patient: 15 minutes

## 2024-03-03 ENCOUNTER — Telehealth: Payer: Self-pay | Admitting: Family Medicine

## 2024-03-03 ENCOUNTER — Telehealth (INDEPENDENT_AMBULATORY_CARE_PROVIDER_SITE_OTHER): Admitting: Family Medicine

## 2024-03-03 DIAGNOSIS — E11319 Type 2 diabetes mellitus with unspecified diabetic retinopathy without macular edema: Secondary | ICD-10-CM | POA: Diagnosis not present

## 2024-03-03 DIAGNOSIS — R1013 Epigastric pain: Secondary | ICD-10-CM

## 2024-03-03 DIAGNOSIS — I10 Essential (primary) hypertension: Secondary | ICD-10-CM | POA: Diagnosis not present

## 2024-03-03 DIAGNOSIS — J4520 Mild intermittent asthma, uncomplicated: Secondary | ICD-10-CM

## 2024-03-03 DIAGNOSIS — G629 Polyneuropathy, unspecified: Secondary | ICD-10-CM

## 2024-03-03 DIAGNOSIS — Z794 Long term (current) use of insulin: Secondary | ICD-10-CM

## 2024-03-03 DIAGNOSIS — Z113 Encounter for screening for infections with a predominantly sexual mode of transmission: Secondary | ICD-10-CM

## 2024-03-03 MED ORDER — DULOXETINE HCL 60 MG PO CPEP: 60.0000 mg | ORAL_CAPSULE | Freq: Every day | ORAL | 3 refills | Status: AC

## 2024-03-03 MED ORDER — PANTOPRAZOLE SODIUM 40 MG PO TBEC: 40.0000 mg | DELAYED_RELEASE_TABLET | Freq: Every day | ORAL | 1 refills | Status: DC

## 2024-03-03 MED ORDER — CETIRIZINE HCL 10 MG PO TABS: 10.0000 mg | ORAL_TABLET | Freq: Every day | ORAL | 3 refills | Status: AC

## 2024-03-03 MED ORDER — ALBUTEROL SULFATE HFA 108 (90 BASE) MCG/ACT IN AERS: 2.0000 | INHALATION_SPRAY | Freq: Four times a day (QID) | RESPIRATORY_TRACT | 3 refills | Status: AC | PRN

## 2024-03-03 MED ORDER — INSULIN GLARGINE 100 UNIT/ML SOLOSTAR PEN: 20.0000 [IU] | PEN_INJECTOR | SUBCUTANEOUS | 3 refills | Status: AC | PRN

## 2024-03-03 MED ORDER — MOUNJARO 15 MG/0.5ML ~~LOC~~ SOAJ: 15.0000 mg | SUBCUTANEOUS | 2 refills | Status: AC

## 2024-03-03 NOTE — Addendum Note (Signed)
 Addended by: DELORES, Shaunae Sieloff on: 03/03/2024 09:20 AM   Modules accepted: Level of Service

## 2024-03-03 NOTE — Assessment & Plan Note (Signed)
 Monitoring labs sent No new partners Will refill pending HIV

## 2024-03-03 NOTE — Patient Instructions (Signed)
 Hi Corinne  It was good to talk today  I included some information about estradiol  pills  My concern is that with your age (over 60) and medical conditions, this could predispose to stroke or heart attacks in the long term  You can get your labs in [place]  I sent in your refills  Follow up in 3 months.   Thank you for choosing [facility].   Please call [phone] with any questions about today's appointment.  Please be sure to schedule follow up at the front  desk before you leave today.   Suzann Daring, MD  Family Medicine

## 2024-03-03 NOTE — Assessment & Plan Note (Signed)
 Reports allergies worsened Refilled antihistamine and inhaler

## 2024-03-03 NOTE — Assessment & Plan Note (Signed)
 Discussed options Increase Cymbalta  to 60 mg Discussed side effects

## 2024-03-03 NOTE — Telephone Encounter (Signed)
 Labs ordered lab collect

## 2024-03-03 NOTE — Assessment & Plan Note (Signed)
 Due for A1c and lipids

## 2024-03-03 NOTE — Assessment & Plan Note (Signed)
 At goal BMP today

## 2024-03-07 ENCOUNTER — Ambulatory Visit: Payer: Self-pay | Admitting: Family Medicine

## 2024-03-07 DIAGNOSIS — Z9189 Other specified personal risk factors, not elsewhere classified: Secondary | ICD-10-CM

## 2024-03-07 LAB — HEMOGLOBIN A1C
Est. average glucose Bld gHb Est-mCnc: 169 mg/dL
Hgb A1c MFr Bld: 7.5 % — ABNORMAL HIGH (ref 4.8–5.6)

## 2024-03-07 LAB — LIPID PANEL
Chol/HDL Ratio: 4.1 ratio (ref 0.0–5.0)
Cholesterol, Total: 140 mg/dL (ref 100–199)
HDL: 34 mg/dL — ABNORMAL LOW (ref >39)
LDL Chol Calc (NIH): 71 mg/dL (ref 0–99)
Triglycerides: 208 mg/dL — ABNORMAL HIGH (ref 0–149)
VLDL Cholesterol Cal: 35 mg/dL (ref 5–40)

## 2024-03-07 LAB — COMPREHENSIVE METABOLIC PANEL WITH GFR
ALT: 45 IU/L — ABNORMAL HIGH (ref 0–44)
AST: 27 IU/L (ref 0–40)
Albumin: 4.8 g/dL (ref 4.1–5.1)
Alkaline Phosphatase: 126 IU/L — ABNORMAL HIGH (ref 47–123)
BUN/Creatinine Ratio: 16 (ref 9–20)
BUN: 19 mg/dL (ref 6–20)
Bilirubin Total: 0.4 mg/dL (ref 0.0–1.2)
CO2: 21 mmol/L (ref 20–29)
Calcium: 10 mg/dL (ref 8.7–10.2)
Chloride: 100 mmol/L (ref 96–106)
Creatinine, Ser: 1.19 mg/dL (ref 0.76–1.27)
Globulin, Total: 2.4 g/dL (ref 1.5–4.5)
Glucose: 104 mg/dL — ABNORMAL HIGH (ref 70–99)
Potassium: 4.4 mmol/L (ref 3.5–5.2)
Sodium: 139 mmol/L (ref 134–144)
Total Protein: 7.2 g/dL (ref 6.0–8.5)
eGFR: 81 mL/min/1.73 (ref >59)

## 2024-03-07 LAB — HIV ANTIBODY (ROUTINE TESTING W REFLEX): HIV Screen 4th Generation wRfx: NONREACTIVE

## 2024-03-07 LAB — HCV INTERPRETATION

## 2024-03-07 LAB — HCV AB W REFLEX TO QUANT PCR: HCV Ab: NONREACTIVE

## 2024-03-07 LAB — HEPATITIS B SURFACE ANTIGEN: Hepatitis B Surface Ag: NEGATIVE

## 2024-03-07 MED ORDER — EMTRICITABINE-TENOFOVIR DF 200-300 MG PO TABS: 1.0000 | ORAL_TABLET | Freq: Every day | ORAL | 0 refills | Status: AC

## 2024-04-07 ENCOUNTER — Other Ambulatory Visit: Payer: Self-pay | Admitting: *Deleted

## 2024-04-07 DIAGNOSIS — Z789 Other specified health status: Secondary | ICD-10-CM

## 2024-04-07 MED ORDER — SPIRONOLACTONE 25 MG PO TABS: 25.0000 mg | ORAL_TABLET | Freq: Two times a day (BID) | ORAL | 2 refills | Status: AC

## 2024-04-07 MED ORDER — METFORMIN HCL ER 750 MG PO TB24: 750.0000 mg | ORAL_TABLET | Freq: Two times a day (BID) | ORAL | 3 refills | Status: AC

## 2024-05-06 ENCOUNTER — Other Ambulatory Visit: Payer: Self-pay | Admitting: *Deleted

## 2024-05-07 MED ORDER — PANTOPRAZOLE SODIUM 40 MG PO TBEC: 40.0000 mg | DELAYED_RELEASE_TABLET | Freq: Every day | ORAL | 1 refills | Status: AC

## 2024-05-16 ENCOUNTER — Telehealth: Admitting: Nurse Practitioner

## 2024-05-16 DIAGNOSIS — K047 Periapical abscess without sinus: Secondary | ICD-10-CM

## 2024-05-16 MED ORDER — PENICILLIN V POTASSIUM 500 MG PO TABS: 500.0000 mg | ORAL_TABLET | Freq: Three times a day (TID) | ORAL | 0 refills | Status: AC

## 2024-05-16 NOTE — Progress Notes (Signed)
 E-Visit for Dental Pain  We are sorry that you are not feeling well.  Here is how we plan to help!  Based on what you have shared with me in the questionnaire, it sounds like you have an infection under a broken tooth   Pen VK 500mg  3 times a day for 7 days  It is imperative that you see a dentist within 10 days of this eVisit to determine the cause of the dental pain and be sure it is adequately treated  A toothache or tooth pain is caused when the nerve in the root of a tooth or surrounding a tooth is irritated. Dental (tooth) infection, decay, injury, or loss of a tooth are the most common causes of dental pain. Pain may also occur after an extraction (tooth is pulled out). Pain sometimes originates from other areas and radiates to the jaw, thus appearing to be tooth pain.Bacteria growing inside your mouth can contribute to gum disease and dental decay, both of which can cause pain. A toothache occurs from inflammation of the central portion of the tooth called pulp. The pulp contains nerve endings that are very sensitive to pain. Inflammation to the pulp or pulpitis may be caused by dental cavities, trauma, and infection.    HOME CARE:   For toothaches: Over-the-counter pain medications such as acetaminophen  or ibuprofen  may be used. Take these as directed on the package while you arrange for a dental appointment. Avoid very cold or hot foods, because they may make the pain worse. You may get relief from biting on a cotton ball soaked in oil of cloves. You can get oil of cloves at most drug stores.  For jaw pain:  Aspirin may be helpful for problems in the joint of the jaw in adults. If pain happens every time you open your mouth widely, the temporomandibular joint (TMJ) may be the source of the pain. Yawning or taking a large bite of food may worsen the pain. An appointment with your doctor or dentist will help you find the cause.     GET HELP RIGHT AWAY IF:  You have a high fever or  chills If you have had a recent head or face injury and develop headache, light headedness, nausea, vomiting, or other symptoms that concern you after an injury to your face or mouth, you could have a more serious injury in addition to your dental injury. A facial rash associated with a toothache: This condition may improve with medication. Contact your doctor for them to decide what is appropriate. Any jaw pain occurring with chest pain: Although jaw pain is most commonly caused by dental disease, it is sometimes referred pain from other areas. People with heart disease, especially people who have had stents placed, people with diabetes, or those who have had heart surgery may have jaw pain as a symptom of heart attack or angina. If your jaw or tooth pain is associated with lightheadedness, sweating, or shortness of breath, you should see a doctor as soon as possible. Trouble swallowing or excessive pain or bleeding from gums: If you have a history of a weakened immune system, diabetes, or steroid use, you may be more susceptible to infections. Infections can often be more severe and extensive or caused by unusual organisms. Dental and gum infections in people with these conditions may require more aggressive treatment. An abscess may need draining or IV antibiotics, for example.  MAKE SURE YOU   Understand these instructions. Will watch your condition. Will get  help right away if you are not doing well or get worse.  Thank you for choosing an e-visit.  Your e-visit answers were reviewed by a board certified advanced clinical practitioner to complete your personal care plan. Depending upon the condition, your plan could have included both over the counter or prescription medications.  Please review your pharmacy choice. Make sure the pharmacy is open so you can pick up prescription now. If there is a problem, you may contact your provider through [employer] and have the prescription routed to  another pharmacy.  Your safety is important to us . If you have drug allergies check your prescription carefully.   For the next 24 hours you can use MyChart to ask questions about today's visit, request a non-urgent call back, or ask for a work or school excuse. You will get an email in the next two days asking about your experience. I hope that your e-visit has been valuable and will speed your recovery.  I have spent 5 minutes in review of e-visit questionnaire, review and updating patient chart, medical decision making and response to patient.   Lauraine Kitty, FNP

## 2024-06-03 ENCOUNTER — Telehealth

## 2024-06-03 DIAGNOSIS — K047 Periapical abscess without sinus: Secondary | ICD-10-CM

## 2024-06-04 NOTE — Progress Notes (Signed)
" °  Because you are having continued swelling and pain following recent antibiotics, I feel your condition warrants further evaluation and I recommend that you be seen in a face-to-face visit. Some dental abscesses are hard to treat and have to be drained.   NOTE: There will be NO CHARGE for this E-Visit   If you are having a true medical emergency, please call 911.     For an urgent face to face visit, [facility] has multiple urgent care centers for your convenience.  Click the link below for the full list of locations and hours, walk-in wait times, appointment scheduling options and driving directions:  Urgent Care - [place], [place], [place], [place], [place], [place]       Your MyChart E-visit questionnaire answers were reviewed by a board certified advanced clinical practitioner to complete your personal care plan based on your specific symptoms.    Thank you for using e-Visits.    "

## 2024-06-11 ENCOUNTER — Other Ambulatory Visit (HOSPITAL_COMMUNITY): Payer: Self-pay

## 2024-06-11 ENCOUNTER — Encounter: Payer: Self-pay | Admitting: Family Medicine

## 2024-06-13 ENCOUNTER — Other Ambulatory Visit: Payer: Self-pay

## 2024-06-16 ENCOUNTER — Ambulatory Visit

## 2024-06-30 ENCOUNTER — Ambulatory Visit: Admitting: Family Medicine
